# Patient Record
Sex: Male | Born: 1960
Health system: Southern US, Community
[De-identification: ages and names within clinical notes are randomized; demographics above are authoritative.]

## PROBLEM LIST (undated history)

## (undated) DIAGNOSIS — R091 Pleurisy: Secondary | ICD-10-CM

## (undated) DIAGNOSIS — G44009 Cluster headache syndrome, unspecified, not intractable: Secondary | ICD-10-CM

## (undated) DIAGNOSIS — I10 Essential (primary) hypertension: Secondary | ICD-10-CM

## (undated) DIAGNOSIS — D75A Glucose-6-phosphate dehydrogenase (G6PD) deficiency without anemia: Secondary | ICD-10-CM

## (undated) HISTORY — DX: Glucose-6-phosphate dehydrogenase (G6PD) deficiency without anemia: D75.A

## (undated) HISTORY — DX: Cluster headache syndrome, unspecified, not intractable: G44.009

## (undated) HISTORY — DX: Essential (primary) hypertension: I10

## (undated) HISTORY — PX: EXTENSOR TENDON OF FOREARM / WRIST REPAIR: SHX1547

## (undated) HISTORY — DX: Pleurisy: R09.1

---

## 1995-04-17 HISTORY — PX: MENISCUS REPAIR: SHX5179

## 1998-04-16 HISTORY — PX: MENISCUS REPAIR: SHX5179

## 2002-04-29 ENCOUNTER — Encounter: Payer: Self-pay | Admitting: Internal Medicine

## 2005-01-31 ENCOUNTER — Ambulatory Visit: Payer: Self-pay | Admitting: Internal Medicine

## 2006-07-23 ENCOUNTER — Emergency Department (HOSPITAL_COMMUNITY): Admission: EM | Admit: 2006-07-23 | Discharge: 2006-07-23 | Payer: Self-pay | Admitting: Family Medicine

## 2007-04-17 HISTORY — PX: ANTERIOR CRUCIATE LIGAMENT REPAIR: SHX115

## 2007-05-12 ENCOUNTER — Telehealth (INDEPENDENT_AMBULATORY_CARE_PROVIDER_SITE_OTHER): Payer: Self-pay | Admitting: *Deleted

## 2008-05-27 ENCOUNTER — Ambulatory Visit: Payer: Self-pay | Admitting: Internal Medicine

## 2008-05-27 DIAGNOSIS — G44009 Cluster headache syndrome, unspecified, not intractable: Secondary | ICD-10-CM | POA: Insufficient documentation

## 2008-05-27 DIAGNOSIS — F17200 Nicotine dependence, unspecified, uncomplicated: Secondary | ICD-10-CM | POA: Insufficient documentation

## 2008-05-27 DIAGNOSIS — G5600 Carpal tunnel syndrome, unspecified upper limb: Secondary | ICD-10-CM | POA: Insufficient documentation

## 2008-05-27 DIAGNOSIS — R002 Palpitations: Secondary | ICD-10-CM | POA: Insufficient documentation

## 2008-05-31 LAB — CONVERTED CEMR LAB
Alkaline Phosphatase: 63 units/L (ref 39–117)
BUN: 14 mg/dL (ref 6–23)
Bilirubin, Direct: 0.1 mg/dL (ref 0.0–0.3)
Chloride: 105 meq/L (ref 96–112)
Creatinine, Ser: 1.2 mg/dL (ref 0.4–1.5)
Eosinophils Absolute: 0.4 10*3/uL (ref 0.0–0.7)
Eosinophils Relative: 6.1 % — ABNORMAL HIGH (ref 0.0–5.0)
GFR calc Af Amer: 83 mL/min
Glucose, Bld: 89 mg/dL (ref 70–99)
MCV: 101.5 fL — ABNORMAL HIGH (ref 78.0–100.0)
Monocytes Relative: 8.7 % (ref 3.0–12.0)
Neutrophils Relative %: 40.9 % — ABNORMAL LOW (ref 43.0–77.0)
Platelets: 183 10*3/uL (ref 150–400)
Total Bilirubin: 0.7 mg/dL (ref 0.3–1.2)
Total Protein: 7.8 g/dL (ref 6.0–8.3)
WBC: 6.3 10*3/uL (ref 4.5–10.5)

## 2008-06-14 ENCOUNTER — Ambulatory Visit: Payer: Self-pay | Admitting: Internal Medicine

## 2008-06-14 DIAGNOSIS — R7401 Elevation of levels of liver transaminase levels: Secondary | ICD-10-CM | POA: Insufficient documentation

## 2008-06-14 DIAGNOSIS — R740 Nonspecific elevation of levels of transaminase and lactic acid dehydrogenase [LDH]: Secondary | ICD-10-CM

## 2008-06-14 DIAGNOSIS — R7402 Elevation of levels of lactic acid dehydrogenase (LDH): Secondary | ICD-10-CM | POA: Insufficient documentation

## 2008-06-15 LAB — CONVERTED CEMR LAB
ALT: 146 units/L — ABNORMAL HIGH (ref 0–53)
AST: 136 units/L — ABNORMAL HIGH (ref 0–37)
Alkaline Phosphatase: 67 units/L (ref 39–117)
Bilirubin, Direct: 0.1 mg/dL (ref 0.0–0.3)
Total Protein: 7.5 g/dL (ref 6.0–8.3)

## 2008-06-18 ENCOUNTER — Ambulatory Visit: Payer: Self-pay | Admitting: Internal Medicine

## 2008-06-18 DIAGNOSIS — B182 Chronic viral hepatitis C: Secondary | ICD-10-CM | POA: Insufficient documentation

## 2008-06-18 DIAGNOSIS — B1920 Unspecified viral hepatitis C without hepatic coma: Secondary | ICD-10-CM | POA: Insufficient documentation

## 2008-06-24 ENCOUNTER — Telehealth: Payer: Self-pay | Admitting: Family Medicine

## 2008-06-24 LAB — CONVERTED CEMR LAB

## 2008-10-05 ENCOUNTER — Encounter (INDEPENDENT_AMBULATORY_CARE_PROVIDER_SITE_OTHER): Payer: Self-pay | Admitting: Internal Medicine

## 2008-10-05 ENCOUNTER — Ambulatory Visit: Payer: Self-pay | Admitting: Family Medicine

## 2008-10-05 DIAGNOSIS — T676XXA Heat fatigue, transient, initial encounter: Secondary | ICD-10-CM | POA: Insufficient documentation

## 2008-10-06 ENCOUNTER — Encounter (INDEPENDENT_AMBULATORY_CARE_PROVIDER_SITE_OTHER): Payer: Self-pay | Admitting: Internal Medicine

## 2009-08-04 ENCOUNTER — Encounter: Payer: Self-pay | Admitting: Internal Medicine

## 2009-08-09 ENCOUNTER — Ambulatory Visit: Payer: Self-pay | Admitting: Internal Medicine

## 2010-05-16 NOTE — Assessment & Plan Note (Signed)
Summary: URI   Vital Signs:  Patient profile:   50 year old male Weight:      169 pounds BMI:     25.79 O2 Sat:      96 % on Room air Temp:     99.0 degrees F oral Pulse rate:   96 / minute Pulse rhythm:   regular Resp:     14 per minute BP sitting:   130 / 80  (left arm) Cuff size:   large  Vitals Entered By: Mervin Hack CMA Duncan Dull) (August 09, 2009 12:59 PM)  O2 Flow:  Room air CC: upper respitatory infection   History of Present Illness: Has chest infection started about 5 days ago but now feels bad Chest is sore Lots of cough--"non stop"  Has bilateral frontal pleuritic chest pain also in neck and back  ?low grade fever Tiring very easy--had to leave work  Slight mucus--yellowish some SOB--mostly DOE Hard to sleep due to cough  Constant rhinorrhea and congestion No ear pain SOme sore throat  using ibuprofen--helps some  Allergies: 1)  ! Sulfasalazine (Sulfasalazine)  Past History:  Past medical, surgical, family and social histories (including risk factors) reviewed for relevance to current acute and chronic problems.  Past Medical History: Reviewed history from 06/15/2008 and no changes required. Cluster headaches ?G6PD deficiency  Past Surgical History: Reviewed history from 05/27/2008 and no changes required.  ~1970 Left forearm repair 1997 Right knee meniscus repair [facility]) 2000 Left knee meniscus repair ([facility]) 2009 Right knee ACL/MCL removed ([facility])  Family History: Reviewed history from 05/27/2008 and no changes required. Dad died @64  CHF, DM Mom died of lung cancer @59  3 brothers--2 with colon polyps 2 sisters No HTN No prostate or colon cancer  Social History: Reviewed history from 05/27/2008 and no changes required. 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  NAD Head:  no sinus tenderness Ears:  R ear normal and L ear normal.   Nose:  mild congestion and inflammation Mouth:  no erythema and no exudates.   Neck:  supple, no masses, and no cervical lymphadenopathy.   Lungs:  normal respiratory effort, no intercostal retractions, no accessory muscle use, no dullness, and no crackles.  Mild exp prolongation and exp wheezes Additional Exam:  spirometry normal   Impression & Recommendations:  Problem # 1:  BRONCHITIS- ACUTE (ICD-466.0) Assessment New  has sig findings of wheezing though normal spirometry seems to have bronchial spasm  will treat with antibiotic antitussive and prednisone  Orders: Spirometry w/Graph (94010)  His updated medication list for this problem includes:    Amoxicillin-pot Clavulanate 875-125 Mg Tabs (Amoxicillin-pot clavulanate) .Marland Kitchen... 1 tab by mouth two times a day after eating for bronchitis  Problem # 2:  CIGARETTE SMOKER (ICD-305.1) Assessment: Comment Only  discussed cessation counselled on options  Orders: Tobacco use cessation intermediate 3-10 minutes (99406)  Problem # 3:  UNSPECIFIED VIRAL HEPATITIS C W/O HEPATIC COMA (ICD-070.70) Assessment: Comment Only hepatitis C chronic still not sure about getting biopsy or considering Rx will discuss at physical  Complete Medication List: 1)  Sumatriptan Succinate 6 Mg/0.55ml Soln (Sumatriptan succinate) .... Inject one at the onset of a cluster headache 2)  Naproxen Sodium 220 Mg Tabs (Naproxen sodium) .... One twice daily 3)  Ibuprofen 200 Mg Tabs (Ibuprofen) .... Otc as directed 4)  Amoxicillin-pot Clavulanate 875-125 Mg Tabs (Amoxicillin-pot clavulanate) .Marland Kitchen.. 1 tab by mouth two times a day after eating for bronchitis 5)  Prednisone 20 Mg Tabs (Prednisone) .... 2 tabs daily for wheezing 6)  Tramadol Hcl 50 Mg Tabs (Tramadol hcl) .Marland Kitchen.. 1 tab by mouth three times a day as needed for severe  cough  Patient Instructions: 1)  Please schedule a follow-up appointment in 4-6  months  for physical Prescriptions: TRAMADOL HCL 50 MG TABS (TRAMADOL HCL) 1 tab by mouth three times a day as needed for severe cough  #30 x 0   Entered and Authorized by:   Cindee Salt MD   Signed by:   Cindee Salt MD on 08/09/2009   Method used:   Electronically to        [facility]* (retail)       [address]       [place], [place]  [address]       Ph: [phone]       Fax: [phone]   RxID:   [id number] PREDNISONE 20 MG TABS (PREDNISONE) 2 tabs daily for wheezing  #10 x 0   Entered and Authorized by:   Cindee Salt MD   Signed by:   Cindee Salt MD on 08/09/2009   Method used:   Electronically to        [facility]* (retail)       [address]       [place], [place]  [address]       Ph: [phone]       Fax: [phone]   RxID:   [id number] AMOXICILLIN-POT CLAVULANATE 875-125 MG TABS (AMOXICILLIN-POT CLAVULANATE) 1 tab by mouth two times a day after eating for bronchitis  #20 x 0   Entered and Authorized by:   Cindee Salt MD   Signed by:   Cindee Salt MD on 08/09/2009   Method used:   Electronically to        [facility]* (retail)       [address]       [place], [place]  [address]       Ph: [phone]       Fax: [phone]   RxID:   [id number]   Current Allergies (reviewed today): ! SULFASALAZINE (SULFASALAZINE)

## 2010-06-28 ENCOUNTER — Inpatient Hospital Stay (INDEPENDENT_AMBULATORY_CARE_PROVIDER_SITE_OTHER)
Admission: RE | Admit: 2010-06-28 | Discharge: 2010-06-28 | Disposition: A | Discharge status: Home / Self Care | Payer: BC Managed Care – PPO | Source: Ambulatory Visit | Attending: Family Medicine | Admitting: Family Medicine

## 2010-06-28 DIAGNOSIS — J40 Bronchitis, not specified as acute or chronic: Secondary | ICD-10-CM

## 2010-06-28 DIAGNOSIS — J069 Acute upper respiratory infection, unspecified: Secondary | ICD-10-CM

## 2010-09-05 ENCOUNTER — Encounter: Payer: Self-pay | Admitting: Internal Medicine

## 2010-09-06 ENCOUNTER — Ambulatory Visit (INDEPENDENT_AMBULATORY_CARE_PROVIDER_SITE_OTHER): Payer: BC Managed Care – PPO | Admitting: Family Medicine

## 2010-09-06 ENCOUNTER — Encounter: Payer: Self-pay | Admitting: Family Medicine

## 2010-09-06 VITALS — BP 130/70 | HR 71 | Temp 98.2°F | Ht 70.0 in | Wt 165.4 lb

## 2010-09-06 DIAGNOSIS — M25519 Pain in unspecified shoulder: Secondary | ICD-10-CM

## 2010-09-06 DIAGNOSIS — M25511 Pain in right shoulder: Secondary | ICD-10-CM | POA: Insufficient documentation

## 2010-09-06 MED ORDER — HYDROCODONE-ACETAMINOPHEN 5-500 MG PO TABS: 1.0000 | ORAL_TABLET | Freq: Every evening | ORAL | Status: DC | PRN

## 2010-09-06 NOTE — Progress Notes (Signed)
Intermittent R shoulder pain.  Pain with ROM above the head.  Lateral shoulder pain.  No known injury, pop, snap.  Woke up with pain.  R handed.  Taking occ aleve or ibuprofen.   Meds, vitals, and allergies reviewed.   ROS: See HPI.  Otherwise, noncontributory.  nad ncat Neck with normal rom R shoulder with normal inspection, pain with ext and terminal phase of lateral abduction.  Hawking's negative but pain with ext rotation.  No pain on int rotation.  Pain with supraspinatus testing.  Distally NV intact. AC joint not ttp on testing.

## 2010-09-06 NOTE — Assessment & Plan Note (Signed)
Likely cuff pathology w/o complete tear.  D/w pt re: aleve with food, and handout given re: shoulder/scapular exercises.  If not improved, call back and we'll discuss options (ie, ortho referral, injection, formal PT).  He agrees, understands.

## 2010-09-06 NOTE — Patient Instructions (Signed)
Use the exercises as we discussed, take the vicodin at night for pain, and take 2 aleve twice a day with food.  Call me if not better.  Take care.

## 2010-12-27 ENCOUNTER — Ambulatory Visit (INDEPENDENT_AMBULATORY_CARE_PROVIDER_SITE_OTHER): Payer: BC Managed Care – PPO | Admitting: Family Medicine

## 2010-12-27 ENCOUNTER — Encounter: Payer: Self-pay | Admitting: Family Medicine

## 2010-12-27 VITALS — BP 130/88 | HR 73 | Temp 98.3°F | Ht 69.0 in | Wt 167.8 lb

## 2010-12-27 DIAGNOSIS — M25519 Pain in unspecified shoulder: Secondary | ICD-10-CM

## 2010-12-27 DIAGNOSIS — M2550 Pain in unspecified joint: Secondary | ICD-10-CM | POA: Insufficient documentation

## 2010-12-27 DIAGNOSIS — M25819 Other specified joint disorders, unspecified shoulder: Secondary | ICD-10-CM

## 2010-12-27 DIAGNOSIS — M7541 Impingement syndrome of right shoulder: Secondary | ICD-10-CM

## 2010-12-27 DIAGNOSIS — M25511 Pain in right shoulder: Secondary | ICD-10-CM

## 2010-12-27 MED ORDER — TRAMADOL HCL 50 MG PO TABS: 50.0000 mg | ORAL_TABLET | Freq: Four times a day (QID) | ORAL | Status: DC | PRN

## 2010-12-27 NOTE — Patient Instructions (Signed)
Recheck in 6 weeks. 

## 2010-12-27 NOTE — Progress Notes (Signed)
Subjective:    Patient ID: Roy Patel, male    DOB: March 31, 1961, 50 y.o.   MRN: [id number]  HPI  Roy Patel, a 50 y.o. male presents today in the office for the following:    Gentleman with a approximate 4-5 month history of right-sided shoulder pain, greater than his left shoulder. Pain with abduction. Pain with internal rotation. History is significant for an open comminuted fracture on the left forearm and resultant deficiency and motion in his left arm as a child.  Continues to have lateral pain, sometimes posterior pain,, but particularly pain with motion. No history of traumatic dislocation or subluxation. The patient is right-hand dominant. He does work at a [employer] course  He has been taking some Aleve.  Polyarthralgia. The patient complains of diffuse polyarthralgia ongoing for about 10 years. He complains of diffuse MCP and PIP as well as PIP pain. He also complains of pain in the true carpal joints. He's had extensive knee problems and 5 different surgeries. Right now he also complains of bilateral shoulder pain. He has no known history or diagnosis of rheumatological dysfunction. No known family history of rheumatological disease. He does complain of pain in the morning, as well as throughout the day. Does have some swelling and bogginess in his hands.  The PMH, PSH, Social History, Family History, Medications, and allergies have been reviewed in [facility], and have been updated if relevant.   Review of Systems REVIEW OF SYSTEMS  GEN: No fevers, chills. Nontoxic. Primarily MSK c/o today. MSK: Detailed in the HPI GI: tolerating PO intake without difficulty Neuro: No numbness, parasthesias, or tingling associated. Otherwise the pertinent positives of the ROS are noted above.      Objective:   Physical Exam   Physical Exam  Blood pressure 130/88, pulse 73, temperature 98.3 F (36.8 C), temperature source Oral, height 5\' 9"  (1.753 m), weight 167 lb 12.8 oz (76.114  kg), SpO2 98.00%.  GEN: Well-developed,well-nourished,in no acute distress; alert,appropriate and cooperative throughout examination HEENT: Normocephalic and atraumatic without obvious abnormalities. Ears, externally no deformities PULM: Breathing comfortably in no respiratory distress EXT: No clubbing, cyanosis, or edema PSYCH: Normally interactive. Cooperative during the interview. Pleasant. Friendly and conversant. Not anxious or depressed appearing. Normal, full affect.  Shoulder: R Inspection: No muscle wasting or winging Ecchymosis/edema: neg  AC joint, scapula, clavicle: NT Cervical spine: NT, full ROM Spurling's: neg Abduction: full, 5/5 - some pain with terminal motion Flexion: full, 5/5 IR, full, lift-off: 5/5 ER at neutral: full, 5/5 AC crossover: neg Neer: pos Hawkins: mild pos Drop Test: neg Empty Can: mild pos Supraspinatus insertion: nt Bicipital groove: NT Speed's: neg Yergason's: neg Crank neg obriens neg Sulcus sign: neg Scapular dyskinesis: none C5-T1 intact  Neuro: Sensation intact Grip 5/5 c      Assessment & Plan:   1. Shoulder pain, right  Ambulatory referral to Physical Therapy  2. Arthralgia  ANA, Cyclic citrul peptide antibody, IgG, Rheumatoid factor, High sensitivity CRP, Sedimentation rate  3. Impingement syndrome of right shoulder  Ambulatory referral to Physical Therapy    Shoulder anatomy was reviewed with the patient using and anatomical model.   Rotator cuff strengthening and scapular stabilization exercises were reviewed with the patient.  [facility] RTC and scapular stabilization program given to the patient.  Retraining shoulder mechanics and function was emphasized to the patient with rehab done at least 5-6 days a week.  The patient could benefit from formal PT to assist with scapular stabilization  and RTC strengthening.  SubAC Injection, R Verbal consent was obtained from the patient. Risks, benefits, and alternatives were  explained. Patient prepped with Betadine and Ethyl Chloride used for anesthesia. The subacromial space was injected using the posterior approach. The patient tolerated the procedure well and had decreased pain post injection. No complications. Injection: 9 cc of Lidocaine 1% and 1cc of depo-medrol 40 mg. Needle: 22 gauge  He certainly could have rheumatological disease, and we'll obtain basic laboratory panels.

## 2010-12-28 ENCOUNTER — Other Ambulatory Visit: Payer: BC Managed Care – PPO

## 2010-12-28 LAB — HIGH SENSITIVITY CRP: CRP, High Sensitivity: 0.68 mg/L (ref 0.000–5.000)

## 2010-12-29 LAB — CYCLIC CITRUL PEPTIDE ANTIBODY, IGG: Cyclic Citrullin Peptide Ab: <2 U/mL (ref 0.0–5.0)

## 2010-12-29 LAB — RHEUMATOID FACTOR: Rhuematoid fact SerPl-aCnc: 22 IU/mL — ABNORMAL HIGH (ref <=14)

## 2010-12-29 LAB — ANA: Anti Nuclear Antibody(ANA): NEGATIVE

## 2011-01-10 ENCOUNTER — Ambulatory Visit: Payer: BC Managed Care – PPO | Admitting: Physical Therapy

## 2011-01-10 ENCOUNTER — Ambulatory Visit: Payer: BC Managed Care – PPO | Attending: Family Medicine | Admitting: Physical Therapy

## 2011-05-16 ENCOUNTER — Ambulatory Visit (INDEPENDENT_AMBULATORY_CARE_PROVIDER_SITE_OTHER)
Admission: RE | Admit: 2011-05-16 | Discharge: 2011-05-16 | Disposition: A | Discharge status: Home / Self Care | Payer: BC Managed Care – PPO | Source: Ambulatory Visit | Attending: Family Medicine | Admitting: Family Medicine

## 2011-05-16 ENCOUNTER — Encounter: Payer: Self-pay | Admitting: Family Medicine

## 2011-05-16 ENCOUNTER — Ambulatory Visit (INDEPENDENT_AMBULATORY_CARE_PROVIDER_SITE_OTHER): Payer: BC Managed Care – PPO | Admitting: Family Medicine

## 2011-05-16 VITALS — BP 116/66 | HR 86 | Temp 98.3°F | Ht 69.0 in | Wt 169.0 lb

## 2011-05-16 DIAGNOSIS — J111 Influenza due to unidentified influenza virus with other respiratory manifestations: Secondary | ICD-10-CM

## 2011-05-16 DIAGNOSIS — F17200 Nicotine dependence, unspecified, uncomplicated: Secondary | ICD-10-CM

## 2011-05-16 DIAGNOSIS — J101 Influenza due to other identified influenza virus with other respiratory manifestations: Secondary | ICD-10-CM

## 2011-05-16 MED ORDER — GUAIFENESIN-CODEINE 100-10 MG/5ML PO SYRP: 5.0000 mL | ORAL_SOLUTION | Freq: Four times a day (QID) | ORAL | Status: AC | PRN

## 2011-05-16 NOTE — Assessment & Plan Note (Signed)
Disc in detail risks of smoking and possible outcomes including copd, vascular/ heart disease, cancer , respiratory and sinus infections  Pt voices understanding  

## 2011-05-16 NOTE — Patient Instructions (Signed)
I think you have the flu - but you are out of the time window for tamiflu  Will do chest xray on the way out -- just because of history of  Pneumonia  Please think about quitting smoking  Robitussin with codiene is for cough - hope that will help you sleep and help suppress cough  Avoid tylenol due to liver issues  When well you need flu shot and pneumovax Schedule follow up with your PCP when feeling better to discuss other symptoms

## 2011-05-16 NOTE — Assessment & Plan Note (Signed)
With cough and intermittent fever  Disc symptomatic care - see instructions on AVS  Given px robitussin with codiene cxr today in light of frequent pneumonia and smoking

## 2011-05-16 NOTE — Progress Notes (Signed)
Subjective:    Patient ID: Roy Patel, male    DOB: 1960/06/13, 51 y.o.   MRN: [id number]  HPI Thinks he may have the flu  Is on day 3 of fever - as high as 103 Started with mild st on fri-- then Sunday really started feeling bad  Feels worse as the day goes on   Is a smoker  Has had pneumonia in the past - 3-4 times No pneumovax and no flu shot   Is coughing -- is dry- and makes him hurt all over  Exhausted  Body aches from head to toe  No appetite Not sleeping well   No n/v/d   St today- not too bad - comes and goes  Nose feels dried out and blowing it -- some sinus pressure Some yellow discharge   Lab Results  Component Value Date   ALT 146* 06/14/2008   AST 136* 06/14/2008   ALKPHOS 67 06/14/2008   BILITOT 0.8 06/14/2008   knows he is not supposed to take tylenol- but has been taking a cold medicine with it anyway Overdue for f/u with pcp about several issues incl liver fxn  Patient Active Problem List  Diagnoses  . UNSPECIFIED VIRAL HEPATITIS C W/O HEPATIC COMA  . CIGARETTE SMOKER  . CLUSTER HEADACHE  . CARPAL TUNNEL SYNDROME  . PALPITATIONS  . NONSPEC ELEVATION OF LEVELS OF TRANSAMINASE/LDH  . HEAT FATIGUE, TRANSIENT  . Shoulder pain, right  . Arthralgia  . Influenza A   Past Medical History  Diagnosis Date  . Cluster headache   . G6PD deficiency     ?   Past Surgical History  Procedure Date  . Extensor tendon of forearm / wrist repair ~1970    Left  . Meniscus repair 1997    [place], Right knee  . Meniscus repair 2000    [facility], Left knee  . Anterior cruciate ligament repair 2009    MCL removed ([facility])   History  Substance Use Topics  . Smoking status: Current Everyday Smoker  . Smokeless tobacco: Not on file  . Alcohol Use: Yes   Family History  Problem Relation Age of Onset  . Cancer Mother     Lung  . Diabetes Father   . Heart disease Father     CHF  . Colon polyps Brother   . Hypertension Neg Hx   . Colon polyps Brother     Allergies  Allergen Reactions  . Sulfasalazine     REACTION: jaundice   Current Outpatient Prescriptions on File Prior to Visit  Medication Sig Dispense Refill  . naproxen sodium (ANAPROX) 220 MG tablet Take 220 mg by mouth 2 (two) times daily with a meal.        . HYDROcodone-acetaminophen (VICODIN) 5-500 MG per tablet Take 1 tablet by mouth at bedtime as needed for pain.  20 tablet  0  . SUMAtriptan (IMITREX) 6 MG/0.5ML SOLN Inject 6 mg into the skin. At the onset of a cluster headache.       . traMADol (ULTRAM) 50 MG tablet Take 1 tablet (50 mg total) by mouth every 6 (six) hours as needed for pain.  40 tablet  3       Review of Systems Review of Systems  Constitutional: Negative for  unexpected weight change. pos for fever/malaise/ fatigue ENT pos for nasal cong and st and neg for severe sinus pain  Eyes: Negative for pain and visual disturbance.  Respiratory: Negative for wheeze or sob  Cardiovascular: Negative for cp or palpitations    Gastrointestinal: Negative for nausea, diarrhea and constipation. neg for abd pain or jaundice  Genitourinary: Negative for urgency and frequency.  Skin: Negative for pallor or rash   Neurological: Negative for weakness, light-headedness, numbness and headaches.  Hematological: Negative for adenopathy. Does not bruise/bleed easily.  Psychiatric/Behavioral: Negative for dysphoric mood. The patient is not nervous/anxious.          Objective:   Physical Exam  Constitutional: He appears well-developed and well-nourished. No distress.  HENT:  Head: Normocephalic and atraumatic.  Right Ear: External ear normal.  Left Ear: External ear normal.  Mouth/Throat: No oropharyngeal exudate.       Nares are injected and congested  No sinus tenderness  throat- post nasal drainage clear    Eyes: Conjunctivae and EOM are normal. Pupils are equal, round, and reactive to light. Right eye exhibits no discharge. Left eye exhibits no discharge. No  scleral icterus.  Neck: Normal range of motion. Neck supple. No JVD present. No thyromegaly present.  Cardiovascular: Normal rate, regular rhythm and normal heart sounds.   Pulmonary/Chest: Effort normal and breath sounds normal. No respiratory distress. He has no wheezes. He has no rales. He exhibits no tenderness.       Diffusely distant bs  Harsh bs throughout   Abdominal: Soft. Bowel sounds are normal.  Lymphadenopathy:    He has no cervical adenopathy.  Neurological: He is alert.  Skin: Skin is warm and dry. No rash noted. No pallor.  Psychiatric: He has a normal mood and affect.          Assessment & Plan:

## 2011-05-18 ENCOUNTER — Telehealth: Payer: Self-pay | Admitting: Internal Medicine

## 2011-05-18 NOTE — Telephone Encounter (Signed)
I did already comment on it -should be in Rena's box ... Clear cxr/ re assuring -- so let me know if not improving clinically within the next week

## 2011-05-21 ENCOUNTER — Telehealth: Payer: Self-pay | Admitting: Internal Medicine

## 2011-05-21 NOTE — Telephone Encounter (Signed)
Triage Record Num: [id number] Operator: Tarri Glenn Patient Name: Roy Patel Call Date & Time: 05/19/2011 9:02:22AM Patient Phone: [phone] PCP: Tillman Abide Patient Gender: Male PCP Fax : [phone] Patient DOB: 08/10/1960 Practice Name[facility] Reason for Call: Caller: Jolene/Spouse; PCP: Tillman Abide I.; CB#: [phone]; Call regarding Fever x6 days; Wife/Jolene is calling about fever. Onset 05/13/11. Patient was seen in office 05/16/11, diagnosed with flu, not tested, and possible pneumonia. Patient continues with fever and sore throat. Temp. 102.0 (O) @ 0200. Unable to recheck temperature at this time as patient is drinking fluids. All emergent s/s r/o with exception to any temperatured elevation in an individual with a weakened immune system or a frail elderly person per Flu-Like Symptoms protocol. Advised wife to take patient to ED/UC, will go to [facility]. Protocol(s) Used: Flu-Like Symptoms Recommended Outcome per Protocol: Call Provider within 4 Hours Reason for Outcome: Any temperature elevation in an individual with a weakened immune system OR a frail elderly person Care Advice: Most adults need to drink 6-10 eight-ounce glasses (1.2-2.0 liters) of fluids per day unless previously told to limit fluid intake for other medical reasons. Limit fluids that contain caffeine, sugar or alcohol. Urine will be a very light yellow color when you drink enough fluids. ~ 05/19/2011 9:13:26AM Page 1 of 1 [id number]

## 2011-05-21 NOTE — Telephone Encounter (Signed)
Patient notified as instructed by telephone. Pt said he feels much better today. If symptoms worsen pt will call back.

## 2011-05-21 NOTE — Telephone Encounter (Signed)
Please check on him 

## 2011-05-23 NOTE — Telephone Encounter (Signed)
Called left message for patient to return my call.

## 2011-05-25 NOTE — Telephone Encounter (Signed)
Patient still not return my call, will wait for pt to return call,

## 2011-06-06 ENCOUNTER — Ambulatory Visit (INDEPENDENT_AMBULATORY_CARE_PROVIDER_SITE_OTHER): Payer: BC Managed Care – PPO | Admitting: Internal Medicine

## 2011-06-06 ENCOUNTER — Encounter: Payer: Self-pay | Admitting: Internal Medicine

## 2011-06-06 VITALS — BP 118/70 | HR 84 | Temp 98.1°F | Ht 69.0 in | Wt 165.0 lb

## 2011-06-06 DIAGNOSIS — L723 Sebaceous cyst: Secondary | ICD-10-CM

## 2011-06-06 MED ORDER — SUMATRIPTAN SUCCINATE 6 MG/0.5ML ~~LOC~~ SOLN: 6.0000 mg | Freq: Once | SUBCUTANEOUS | Status: DC

## 2011-06-06 NOTE — Progress Notes (Signed)
  Subjective:    Patient ID: Roy Patel, male    DOB: 1960/07/22, 51 y.o.   MRN: [id number]  HPI Has some bumps on his neck Thought it was related to cluster headaches---gets knots there when the headaches  Notes different lumps for about 1 weeks 2 now--- 1 on either side Right one seems to be getting larger, left one may be larger Not red, warm or tender  Having trouble with skin Itchy Rash on back finally clearing some Tried neosporin body wash and sarna lotion Generally a winter time phenomenon Mostly a problem at night---has tried benedryl and zyrtec Gas heat  Current Outpatient Prescriptions on File Prior to Visit  Medication Sig Dispense Refill  . ibuprofen (ADVIL,MOTRIN) 200 MG tablet Take 800 mg by mouth every 6 (six) hours as needed.      . naproxen sodium (ANAPROX) 220 MG tablet Take 220 mg by mouth 2 (two) times daily with a meal.        . SUMAtriptan (IMITREX) 6 MG/0.5ML SOLN Inject 6 mg into the skin. At the onset of a cluster headache.         Allergies  Allergen Reactions  . Sulfasalazine     REACTION: jaundice    Past Medical History  Diagnosis Date  . Cluster headache   . G6PD deficiency     ?    Past Surgical History  Procedure Date  . Extensor tendon of forearm / wrist repair ~1970    Left  . Meniscus repair 1997    [place], Right knee  . Meniscus repair 2000    [facility], Left knee  . Anterior cruciate ligament repair 2009    MCL removed ([facility])    Family History  Problem Relation Age of Onset  . Cancer Mother     Lung  . Diabetes Father   . Heart disease Father     CHF  . Colon polyps Brother   . Hypertension Neg Hx   . Colon polyps Brother     History   Social History  . Marital Status: Single    Spouse Name: N/A    Number of Children: 1  . Years of Education: N/A   Occupational History  . Golf Maintenance at [employer]    Social History Main Topics  . Smoking status: Current Everyday Smoker  . Smokeless tobacco:  Never Used  . Alcohol Use: Yes  . Drug Use: Not on file  . Sexually Active: Not on file   Other Topics Concern  . Not on file   Social History Narrative   Married:  2ndSon in [place] daughter here.   Review of Systems Flu symptoms lingered for about 2 weeks---fever for 7-8 days Appetite is okay again     Objective:   Physical Exam  Constitutional: He appears well-developed and well-nourished. No distress.  Neck: Normal range of motion. Neck supple.       Very small node in left posterior chain  No inflammation   Lymphadenopathy:    He has cervical adenopathy.  Skin: Skin is dry.       Small cyst on right posterior neck Dry skin without rash          Assessment & Plan:

## 2011-06-06 NOTE — Assessment & Plan Note (Signed)
Reassured No action needed Remove only if infected, etc  Discussed humidifier and moisturizers for his skin

## 2011-06-08 ENCOUNTER — Telehealth: Payer: Self-pay

## 2011-06-08 MED ORDER — HYDROXYZINE HCL 25 MG PO TABS: 25.0000 mg | ORAL_TABLET | Freq: Three times a day (TID) | ORAL | Status: DC | PRN

## 2011-06-08 NOTE — Telephone Encounter (Signed)
rx sent to pharmacy by e-script  

## 2011-06-08 NOTE — Telephone Encounter (Signed)
I forgot  Atarax (hydroxyzine) 25 mg tid prn for itching #90 x 1

## 2011-06-08 NOTE — Telephone Encounter (Signed)
Received fax from patients pharmacy.  Patient was seen on 06/06/11 and said he was supposed to have a prescription for Atarax sent to his pharmacy.  They have not received.

## 2011-07-18 ENCOUNTER — Encounter: Payer: Self-pay | Admitting: Family Medicine

## 2011-07-18 ENCOUNTER — Encounter: Payer: Self-pay | Admitting: *Deleted

## 2011-07-18 ENCOUNTER — Ambulatory Visit (INDEPENDENT_AMBULATORY_CARE_PROVIDER_SITE_OTHER): Payer: BC Managed Care – PPO | Admitting: Family Medicine

## 2011-07-18 VITALS — BP 120/72 | HR 94 | Temp 98.3°F | Ht 69.0 in | Wt 161.1 lb

## 2011-07-18 DIAGNOSIS — J157 Pneumonia due to Mycoplasma pneumoniae: Secondary | ICD-10-CM

## 2011-07-18 DIAGNOSIS — J9801 Acute bronchospasm: Secondary | ICD-10-CM

## 2011-07-18 DIAGNOSIS — J189 Pneumonia, unspecified organism: Secondary | ICD-10-CM

## 2011-07-18 MED ORDER — AZITHROMYCIN 250 MG PO TABS: ORAL_TABLET | ORAL | Status: AC

## 2011-07-18 MED ORDER — PREDNISONE 20 MG PO TABS: ORAL_TABLET | ORAL | Status: AC

## 2011-07-18 NOTE — Progress Notes (Signed)
  Patient Name: Roy Patel Date of Birth: Sep 02, 1960 Age: 51 y.o. Medical Record Number: [id number] Gender: male Date of Encounter: 07/18/2011  History of Present Illness:  Roy Patel is a 51 y.o. very pleasant male patient who presents with the following:  Has had a coldfor about 3-4 weeks, and had some uri sx with cough and congestion that has worsened and gotten into his chest. Has been in the bed since yesterday. Got done yesterday around 11. + Smoker Productive cough - feels much worse in his chest.  The patient got very short winded very easily yesterday with minimal exertion while at work.   Past Medical History, Surgical History, Social History, Family History, Problem List, Medications, and Allergies have been reviewed and updated if relevant.  Review of Systems: ROS: GEN: Acute illness details above GI: Tolerating PO intake GU: maintaining adequate hydration and urination Pulm: above Interactive and getting along well at home.  Otherwise, ROS is as per the HPI.   Physical Examination: Filed Vitals:   07/18/11 1510  BP: 120/72  Pulse: 94  Temp: 98.3 F (36.8 C)  TempSrc: Oral  Height: 5\' 9"  (1.753 m)  Weight: 161 lb 1.9 oz (73.084 kg)  SpO2: 98%    Body mass index is 23.79 kg/(m^2).   GEN: A and O x 3. WDWN. NAD.    ENT: Nose clear, ext NML.  No LAD.  No JVD.  TM's clear. Oropharynx clear.  PULM: Normal WOB, no distress. Diffuse scattered rhonchi. Intermittent wheezing, mildly  CV: RRR, no M/G/R, No rubs, No JVD.   EXT: warm and well-perfused, No c/c/e. PSYCH: Pleasant and conversant.   Assessment and Plan:  1. Walking pneumonia  azithromycin (ZITHROMAX Z-PAK) 250 MG tablet, predniSONE (DELTASONE) 20 MG tablet  2. Bronchospasm  azithromycin (ZITHROMAX Z-PAK) 250 MG tablet, predniSONE (DELTASONE) 20 MG tablet   Take the next 2 days off of work, treat with antibiotics and prednisone for wheezing and bronchospasm. H/o tob concern for potential  underlying copd  Orders Today: No orders of the defined types were placed in this encounter.    Medications Today: Meds ordered this encounter  Medications  . azithromycin (ZITHROMAX Z-PAK) 250 MG tablet    Sig: Take 2 tablets (500 mg) on  Day 1,  followed by 1 tablet (250 mg) once daily on Days 2 through 5.    Dispense:  6 each    Refill:  0  . predniSONE (DELTASONE) 20 MG tablet    Sig: 2 tabs po x 5 days, then 1 tab po x 3 days    Dispense:  21 tablet    Refill:  0

## 2011-09-28 ENCOUNTER — Encounter: Payer: BC Managed Care – PPO | Admitting: Internal Medicine

## 2011-10-22 ENCOUNTER — Other Ambulatory Visit: Payer: Self-pay | Admitting: *Deleted

## 2011-10-22 NOTE — Telephone Encounter (Signed)
Okay #9 x 3

## 2011-10-22 NOTE — Telephone Encounter (Signed)
Note from pharmacy, pt is in a cluster migraine cycle and needs a refill, please advise

## 2011-10-23 ENCOUNTER — Telehealth: Payer: Self-pay | Admitting: *Deleted

## 2011-10-23 MED ORDER — SUMATRIPTAN SUCCINATE 6 MG/0.5ML ~~LOC~~ SOLN: 6.0000 mg | Freq: Once | SUBCUTANEOUS | Status: DC

## 2011-10-23 NOTE — Telephone Encounter (Signed)
Form faxed back.

## 2011-10-23 NOTE — Telephone Encounter (Signed)
Patient's wife, Roy Patel, called to request that the authorization for the patient's medications for cluster headaches be sent back today for refill.  He is having cluster headaches and only has enough meds for today and Rob and Marcheta Grammes said that he sent over refill request and she said that she had started process for authorization with [employer] and the Case ID is [id number].  Please review, sign and return today to expedite.  Patient's wife's number is [phone] and home number is [phone].  Please call to confirm and complete requests.

## 2011-10-23 NOTE — Telephone Encounter (Signed)
Prior auth form on your desk for SUMATRIPTAN SUCCINATE

## 2011-10-23 NOTE — Telephone Encounter (Signed)
Form done 

## 2011-10-23 NOTE — Telephone Encounter (Signed)
rx sent to pharmacy by e-script  

## 2011-10-24 NOTE — Telephone Encounter (Signed)
I filled in the last box but wasn't sure how many to request for a month---so I put down #20 He needs it daily when the cluster headaches hit

## 2011-10-24 NOTE — Telephone Encounter (Signed)
Form faxed back again

## 2011-10-24 NOTE — Telephone Encounter (Signed)
[employer] sent form back that some sections where not filled out, see form on your desk.

## 2014-04-28 ENCOUNTER — Ambulatory Visit (INDEPENDENT_AMBULATORY_CARE_PROVIDER_SITE_OTHER): Payer: BLUE CROSS/BLUE SHIELD | Admitting: Internal Medicine

## 2014-04-28 ENCOUNTER — Encounter: Payer: Self-pay | Admitting: Internal Medicine

## 2014-04-28 VITALS — BP 158/98 | HR 75 | Temp 98.4°F | Wt 167.0 lb

## 2014-04-28 DIAGNOSIS — I10 Essential (primary) hypertension: Secondary | ICD-10-CM

## 2014-04-28 DIAGNOSIS — R42 Dizziness and giddiness: Secondary | ICD-10-CM

## 2014-04-28 MED ORDER — HYDROCHLOROTHIAZIDE 25 MG PO TABS: 25.0000 mg | ORAL_TABLET | Freq: Every day | ORAL | Status: DC

## 2014-04-28 NOTE — Patient Instructions (Signed)

## 2014-04-28 NOTE — Progress Notes (Signed)
Pre visit review using our clinic review tool, if applicable. No additional management support is needed unless otherwise documented below in the visit note. 

## 2014-04-28 NOTE — Progress Notes (Signed)
Subjective:    Patient ID: Roy SierrasMark G Schuitema, male    DOB: 05/12/1960, 54 y.o.   MRN: [id number]  HPI  Pt presents to the clinic today with c/o dizziness. He reports this started 1 month ago. It is intermittent. He reports that the room is not spinning but her feels more of a sense of imbalanced. He gets dizzy with position changes. He has also has walked into a wall because of the dizziness. His blood pressure is elevated today at 154/102. He has never been told that he has had high blood pressure in the past.  Review of Systems  Past Medical History  Diagnosis Date  . Cluster headache   . G6PD deficiency     ?    Current Outpatient Prescriptions  Medication Sig Dispense Refill  . hydrOXYzine (ATARAX/VISTARIL) 25 MG tablet Take 1 tablet (25 mg total) by mouth 3 (three) times daily as needed. 90 tablet 1  . ibuprofen (ADVIL,MOTRIN) 200 MG tablet Take 800 mg by mouth every 6 (six) hours as needed.    . naproxen sodium (ANAPROX) 220 MG tablet Take 220 mg by mouth 2 (two) times daily with a meal.      . SUMAtriptan (IMITREX) 6 MG/0.5ML SOLN injection Inject 0.5 mLs (6 mg total) into the skin once. At the onset of a cluster headache. 4.5 mL 3   No current facility-administered medications for this visit.    Allergies  Allergen Reactions  . Sulfasalazine     REACTION: jaundice    Family History  Problem Relation Age of Onset  . Cancer Mother     Lung  . Diabetes Father   . Heart disease Father     CHF  . Colon polyps Brother   . Hypertension Neg Hx   . Colon polyps Brother     History   Social History  . Marital Status: Single    Spouse Name: N/A    Number of Children: 1  . Years of Education: N/A   Occupational History  . Golf Maintenance at [employer]    Social History Main Topics  . Smoking status: Current Every Day Smoker -- 1.00 packs/day    Types: Cigarettes  . Smokeless tobacco: Never Used  . Alcohol Use: 0.0 oz/week    0 Not specified per week   Comment: occasional  . Drug Use: Not on file  . Sexual Activity: Not on file   Other Topics Concern  . Not on file   Social History Narrative   Married:  2nd   Son in [place]   Step daughter here.     Constitutional: Denies fever, malaise, fatigue, headache or abrupt weight changes.  HEENT: Denies eye pain, eye redness, ear pain, ringing in the ears, wax buildup, runny nose, nasal congestion, bloody nose, or sore throat. Respiratory: Denies difficulty breathing, shortness of breath, cough or sputum production.   Cardiovascular: Denies chest pain, chest tightness, palpitations or swelling in the hands or feet.   Skin: Denies redness, rashes, lesions or ulcercations.  Neurological: dizziness. Denies difficulty with memory, difficulty with speech or problems with balance and coordination.   No other specific complaints in a complete review of systems (except as listed in HPI above).     Objective:   Physical Exam  BP 158/98 mmHg  Pulse 75  Temp(Src) 98.4 F (36.9 C) (Oral)  Wt 167 lb (75.751 kg)  SpO2 98% Wt Readings from Last 3 Encounters:  04/28/14 167 lb (75.751 kg)  07/18/11  161 lb 1.9 oz (73.084 kg)  06/06/11 165 lb (74.844 kg)    General: Appears his stated age, well developed, well nourished in NAD. Skin: Warm, dry and intact. No rashes, lesions or ulcerations noted. HEENT: Head: normal shape and size; Eyes: sclera white, no icterus, conjunctiva pink, PERRLA and EOMs intact; Ears: Tm's gray and intact, normal light reflex;  Cardiovascular: Normal rate and rhythm. S1,S2 noted.  No murmur, rubs or gallops noted. No JVD or BLE edema. No carotid bruits noted. Pulmonary/Chest: Normal effort and positive vesicular breath sounds. No respiratory distress. No wheezes, rales or ronchi noted.  Neurological: Alert and oriented.    BMET    Component Value Date/Time   NA 140 05/27/2008 1544   K 4.6 05/27/2008 1544   CL 105 05/27/2008 1544   CO2 30 05/27/2008 1544   GLUCOSE 89  05/27/2008 1544   BUN 14 05/27/2008 1544   CREATININE 1.2 05/27/2008 1544   CALCIUM 9.9 05/27/2008 1544   GFRNONAA 69 05/27/2008 1544   GFRAA 83 05/27/2008 1544    Lipid Panel  No results found for: CHOL, TRIG, HDL, CHOLHDL, VLDL, LDLCALC  CBC    Component Value Date/Time   WBC 6.3 05/27/2008 1544   RBC 4.03* 05/27/2008 1544   HGB 14.1 05/27/2008 1544   HCT 40.9 05/27/2008 1544   PLT 183 05/27/2008 1544   MCV 101.5* 05/27/2008 1544   MCHC 34.5 05/27/2008 1544   RDW 12.5 05/27/2008 1544   MONOABS 0.5 05/27/2008 1544   EOSABS 0.4 05/27/2008 1544   BASOSABS 0.1 05/27/2008 1544    Hgb A1C No results found for: HGBA1C       Assessment & Plan:   Dizziness:  Orthostatics normal ? If this is related to HBP Will treat with HCTZ 25 mg Stop smoking Will recheck BP and reassess dizziness in 3 weeks

## 2014-04-29 ENCOUNTER — Telehealth: Payer: Self-pay | Admitting: Internal Medicine

## 2014-04-29 NOTE — Telephone Encounter (Signed)
emmi emailed °

## 2014-04-30 ENCOUNTER — Telehealth: Payer: Self-pay | Admitting: Family Medicine

## 2014-04-30 ENCOUNTER — Telehealth: Payer: Self-pay

## 2014-04-30 NOTE — Telephone Encounter (Signed)
It is okay for him to be taking the HCTZ with a sulfa allergy. He needs to restart.

## 2014-04-30 NOTE — Telephone Encounter (Signed)
Roy Patel left v/m; pt seen 04/28/14 and was started on HCTZ and pts wife read info with med that HCTZ is contraindicated with pts with sulfa allergies; Roy Patel advised pt to stop taking HCTZ and request cb to advise what substitute med can be given. [facility]. Do not find DPR signed to release info to anyone.Please advise.

## 2014-05-03 NOTE — Telephone Encounter (Signed)
Please refer to msg below and advise as wife wants to know what to do--

## 2014-05-03 NOTE — Telephone Encounter (Signed)
There is a possible cross reactivity reaction, yes, and a possibility he could be allergic to HCTZ.  I think it is quite rare but if he is uncomfortable, you could try a different rx.

## 2014-05-03 NOTE — Telephone Encounter (Signed)
Can try chlorthalidone instead--- I don't think there is any sulfa crossreactivity to that If he wants to, start 25mg  daily  #30 x 3 Make sure he has follow up in 6-8 weeks

## 2014-05-03 NOTE — Telephone Encounter (Signed)
Patient's wife,Jolene, called to find out about the prescription.  Please call her back at [phone].

## 2014-05-03 NOTE — Telephone Encounter (Signed)
Which medication would you suggest if pt wants to try something different as i want to make sure i have answers if asked--please advise

## 2014-05-03 NOTE — Telephone Encounter (Signed)
Pt's wife called back and states the sulfasalazine allergy that was listed in chart was wrong--it was suppose to state sulfa Ax--this has been changed in chart. However she wants to know if response below would still appy--please advise

## 2014-05-04 MED ORDER — CHLORTHALIDONE 25 MG PO TABS: 25.0000 mg | ORAL_TABLET | Freq: Every day | ORAL | Status: DC

## 2014-05-04 NOTE — Addendum Note (Signed)
Addended by: Sueanne MargaritaSMITH, DESHANNON L on: 05/04/2014 03:33 PM   Modules accepted: Orders, Medications

## 2014-05-04 NOTE — Telephone Encounter (Addendum)
Jolene, pts wife left v/m requesting cb today [phone] or [phone] about contraindication of taking HCTZ. ? DPR signed.

## 2014-05-04 NOTE — Telephone Encounter (Signed)
Spoke with wife and advised results, pt did not have a reaction to HCTZ, his wife stated that in the leaflet it read that if he was allergic to Sulfa to stop taking the medication. rx sent to pharmacy by e-script F/u appt scheduled

## 2014-05-12 NOTE — Telephone Encounter (Signed)
Melanie, please check into this. Originally sent to you by Lyla Sonarrie on 1/18.  Thanks!

## 2014-05-19 ENCOUNTER — Ambulatory Visit: Payer: BLUE CROSS/BLUE SHIELD | Admitting: Internal Medicine

## 2014-05-27 ENCOUNTER — Ambulatory Visit (INDEPENDENT_AMBULATORY_CARE_PROVIDER_SITE_OTHER): Payer: BLUE CROSS/BLUE SHIELD | Admitting: Internal Medicine

## 2014-05-27 ENCOUNTER — Encounter: Payer: Self-pay | Admitting: Internal Medicine

## 2014-05-27 VITALS — BP 130/86 | HR 75 | Temp 98.1°F | Wt 165.8 lb

## 2014-05-27 DIAGNOSIS — I10 Essential (primary) hypertension: Secondary | ICD-10-CM

## 2014-05-27 DIAGNOSIS — Z23 Encounter for immunization: Secondary | ICD-10-CM

## 2014-05-27 MED ORDER — LOSARTAN POTASSIUM 50 MG PO TABS: 50.0000 mg | ORAL_TABLET | Freq: Every day | ORAL | Status: DC

## 2014-05-27 NOTE — Patient Instructions (Signed)
Please call 1-800 QUIT NOW for advice about quitting smoking. I recommend stopping cold [place],  using a nicotine patch daily for at least 2-3 months and use a nicotine lozenge if you get an urge to smoke (so DON"T!!).

## 2014-05-27 NOTE — Assessment & Plan Note (Signed)
BP Readings from Last 3 Encounters:  05/27/14 130/86  04/28/14 158/98  07/18/11 120/72   Dizziness is gone so probably was from the blood pressure Unable to tolerate the chlorthalidone Will change to losartan

## 2014-05-27 NOTE — Progress Notes (Signed)
Pre visit review using our clinic review tool, if applicable. No additional management support is needed unless otherwise documented below in the visit note. 

## 2014-05-27 NOTE — Progress Notes (Signed)
Subjective:    Patient ID: Roy SierrasMark G Hoeppner, male    DOB: 01/10/1961, 54 y.o.   MRN: [id number]  HPI Here for follow up of dizziness This does seem better  Feels a little lethargic Some headaches---different than his clusters Had drenching sweat one night Slight chest pain since last visit--just sitting around. Only lasted a few seconds  Current Outpatient Prescriptions on File Prior to Visit  Medication Sig Dispense Refill  . chlorthalidone (HYGROTON) 25 MG tablet Take 1 tablet (25 mg total) by mouth daily. 30 tablet 3  . hydrOXYzine (ATARAX/VISTARIL) 25 MG tablet Take 1 tablet (25 mg total) by mouth 3 (three) times daily as needed. 90 tablet 1  . ibuprofen (ADVIL,MOTRIN) 200 MG tablet Take 800 mg by mouth every 6 (six) hours as needed.    . naproxen sodium (ANAPROX) 220 MG tablet Take 220 mg by mouth 2 (two) times daily with a meal.      . SUMAtriptan (IMITREX) 6 MG/0.5ML SOLN injection Inject 0.5 mLs (6 mg total) into the skin once. At the onset of a cluster headache. 4.5 mL 3   No current facility-administered medications on file prior to visit.    Allergies  Allergen Reactions  . Sulfa Antibiotics Other (See Comments)    Jaundice    Past Medical History  Diagnosis Date  . Cluster headache   . G6PD deficiency     ?    Past Surgical History  Procedure Laterality Date  . Extensor tendon of forearm / wrist repair  ~1970    Left  . Meniscus repair  1997    [place], Right knee  . Meniscus repair  2000    [place], Left knee  . Anterior cruciate ligament repair  2009    MCL removed ([facility])    Family History  Problem Relation Age of Onset  . Cancer Mother     Lung  . Diabetes Father   . Heart disease Father     CHF  . Colon polyps Brother   . Hypertension Neg Hx   . Colon polyps Brother     History   Social History  . Marital Status: Single    Spouse Name: N/A  . Number of Children: 1  . Years of Education: N/A   Occupational History  . Golf  Maintenance at [employer]    Social History Main Topics  . Smoking status: Current Every Day Smoker -- 1.00 packs/day    Types: Cigarettes  . Smokeless tobacco: Never Used  . Alcohol Use: 0.0 oz/week    0 Standard drinks or equivalent per week     Comment: occasional  . Drug Use: Not on file  . Sexual Activity: Not on file   Other Topics Concern  . Not on file   Social History Narrative   Married:  2nd   Son in [place]   Step daughter here.   Review of Systems Appetite is good Sleeping okay Recent eye check---BP noted to be high then Takes 800mg  ibuprofen many days    Objective:   Physical Exam  Constitutional: He appears well-developed and well-nourished. No distress.  Neck: Normal range of motion. Neck supple. No thyromegaly present.  Cardiovascular: Normal rate, regular rhythm and normal heart sounds.  Exam reveals no gallop.   No murmur heard. Pulmonary/Chest: Effort normal and breath sounds normal. No respiratory distress. He has no wheezes. He has no rales.  Lymphadenopathy:    He has no cervical adenopathy.  Psychiatric: He  has a normal mood and affect. His behavior is normal.          Assessment & Plan:

## 2014-05-28 ENCOUNTER — Telehealth: Payer: Self-pay | Admitting: Internal Medicine

## 2014-05-28 NOTE — Telephone Encounter (Signed)
emmi emailed °

## 2014-07-26 ENCOUNTER — Ambulatory Visit: Payer: BLUE CROSS/BLUE SHIELD | Admitting: Internal Medicine

## 2015-02-01 ENCOUNTER — Ambulatory Visit (INDEPENDENT_AMBULATORY_CARE_PROVIDER_SITE_OTHER): Payer: BLUE CROSS/BLUE SHIELD | Admitting: Family Medicine

## 2015-02-01 ENCOUNTER — Encounter: Payer: Self-pay | Admitting: Family Medicine

## 2015-02-01 VITALS — BP 138/72 | HR 88 | Temp 98.5°F | Ht 69.0 in | Wt 156.8 lb

## 2015-02-01 DIAGNOSIS — J209 Acute bronchitis, unspecified: Secondary | ICD-10-CM | POA: Insufficient documentation

## 2015-02-01 DIAGNOSIS — S161XXA Strain of muscle, fascia and tendon at neck level, initial encounter: Secondary | ICD-10-CM | POA: Diagnosis not present

## 2015-02-01 MED ORDER — AZITHROMYCIN 250 MG PO TABS: ORAL_TABLET | ORAL | Status: DC

## 2015-02-01 NOTE — Assessment & Plan Note (Signed)
No sign of cervical radiculopathy. Ost likely due to MSK strain versus OA in neck.  Treat with heat, massage, NSAIDs, gentle ROM exercises.

## 2015-02-01 NOTE — Assessment & Plan Note (Signed)
Treat with antibitoics given recurrent fever after 2 weeks and smoking history.

## 2015-02-01 NOTE — Progress Notes (Signed)
Pre visit review using our clinic review tool, if applicable. No additional management support is needed unless otherwise documented below in the visit note. 

## 2015-02-01 NOTE — Progress Notes (Signed)
Subjective:    Patient ID: Roy SierrasMark G Patel, male    DOB: 09/23/1960, 54 y.o.   MRN: [id number]  Cough This is a new problem. The current episode started 1 to 4 weeks ago ( 2 weeks). The problem has been waxing and waning. The cough is productive of purulent sputum and productive of sputum. Associated symptoms include a fever, myalgias, a sore throat, shortness of breath and wheezing. Pertinent negatives include no ear congestion, ear pain or nasal congestion. Associated symptoms comments: decreased energy.. Risk factors for lung disease include smoking/tobacco exposure. Treatments tried:  took 2 days of old antibiotics, not sure what it was felt better until 1 week later on call MD per wife insurance treated him for flu with tamiflu. Symptoms recurred. His past medical history is significant for bronchitis and pneumonia. There is no history of environmental allergies.  Fever  This is a new problem. The current episode started 1 to 4 weeks ago. The problem has been waxing and waning. The maximum temperature noted was 101 to 101.9 F. Associated symptoms include coughing, a sore throat and wheezing. Pertinent negatives include no ear pain.      Review of Systems  Constitutional: Positive for fever.  HENT: Positive for sore throat. Negative for ear pain.   Respiratory: Positive for cough, shortness of breath and wheezing.   Musculoskeletal: Positive for myalgias.  Allergic/Immunologic: Negative for environmental allergies.       Objective:   Physical Exam  Constitutional: Vital signs are normal. He appears well-developed and well-nourished.  Non-toxic appearance. He does not appear ill. No distress.  HENT:  Head: Normocephalic and atraumatic.  Right Ear: Hearing, tympanic membrane, external ear and ear canal normal. No tenderness. No foreign bodies. Tympanic membrane is not retracted and not bulging.  Left Ear: Hearing, tympanic membrane, external ear and ear canal normal. No tenderness. No  foreign bodies. Tympanic membrane is not retracted and not bulging.  Nose: Nose normal. No mucosal edema or rhinorrhea. Right sinus exhibits no maxillary sinus tenderness and no frontal sinus tenderness. Left sinus exhibits no maxillary sinus tenderness and no frontal sinus tenderness.  Mouth/Throat: Uvula is midline, oropharynx is clear and moist and mucous membranes are normal. Normal dentition. No dental caries. No oropharyngeal exudate or tonsillar abscesses.  Eyes: Conjunctivae, EOM and lids are normal. Pupils are equal, round, and reactive to light. Lids are everted and swept, no foreign bodies found.  Neck: Trachea normal, normal range of motion and phonation normal. Neck supple. Carotid bruit is not present. No thyroid mass and no thyromegaly present.  Cardiovascular: Normal rate, regular rhythm, S1 normal, S2 normal, normal heart sounds, intact distal pulses and normal pulses.  Exam reveals no gallop.   No murmur heard. Pulmonary/Chest: Effort normal and breath sounds normal. No respiratory distress. He has no wheezes. He has no rhonchi. He has no rales.  Abdominal: Soft. Normal appearance and bowel sounds are normal. There is no hepatosplenomegaly. There is no tenderness. There is no rebound, no guarding and no CVA tenderness. No hernia.  Musculoskeletal:       Cervical back: He exhibits decreased range of motion and tenderness. He exhibits no bony tenderness, no swelling, no edema and no deformity.  Neg spurling's  Neurological: He is alert. He has normal strength and normal reflexes. No sensory deficit. He exhibits normal muscle tone.  Skin: Skin is warm, dry and intact. No rash noted.  Psychiatric: He has a normal mood and affect. His speech is normal and  behavior is normal. Judgment normal.          Assessment & Plan:

## 2015-02-01 NOTE — Patient Instructions (Addendum)
Complete Z-pack.  Mucinex DM twice daily.  Start flonase  2 sprays per nostril daily for chronic congestion/allergies.  Rest, drink water.  Stop smoking.   Start gentle stretches of neck as reviewed. Statr ibuprofen 800 mg three times daily for pain and inflammation.  Heat and massage on neck . If not improving in 2 weeks, follow up with Dr. Alphonsus SiasLetvak.

## 2015-02-05 ENCOUNTER — Emergency Department (HOSPITAL_COMMUNITY): Payer: BLUE CROSS/BLUE SHIELD

## 2015-02-05 ENCOUNTER — Encounter (HOSPITAL_COMMUNITY): Payer: Self-pay | Admitting: Emergency Medicine

## 2015-02-05 ENCOUNTER — Inpatient Hospital Stay (HOSPITAL_COMMUNITY)
Admission: EM | Admit: 2015-02-05 | Discharge: 2015-02-08 | DRG: 194 | Disposition: A | Discharge status: Home / Self Care | Payer: BLUE CROSS/BLUE SHIELD | Attending: Internal Medicine | Admitting: Internal Medicine

## 2015-02-05 DIAGNOSIS — J189 Pneumonia, unspecified organism: Secondary | ICD-10-CM | POA: Diagnosis present

## 2015-02-05 DIAGNOSIS — D550 Anemia due to glucose-6-phosphate dehydrogenase [G6PD] deficiency: Secondary | ICD-10-CM | POA: Diagnosis not present

## 2015-02-05 DIAGNOSIS — I319 Disease of pericardium, unspecified: Secondary | ICD-10-CM | POA: Diagnosis present

## 2015-02-05 DIAGNOSIS — R079 Chest pain, unspecified: Secondary | ICD-10-CM | POA: Diagnosis not present

## 2015-02-05 DIAGNOSIS — Z8249 Family history of ischemic heart disease and other diseases of the circulatory system: Secondary | ICD-10-CM | POA: Diagnosis not present

## 2015-02-05 DIAGNOSIS — D75A Glucose-6-phosphate dehydrogenase (G6PD) deficiency without anemia: Secondary | ICD-10-CM | POA: Diagnosis present

## 2015-02-05 DIAGNOSIS — R109 Unspecified abdominal pain: Secondary | ICD-10-CM

## 2015-02-05 DIAGNOSIS — Z833 Family history of diabetes mellitus: Secondary | ICD-10-CM | POA: Diagnosis not present

## 2015-02-05 DIAGNOSIS — J209 Acute bronchitis, unspecified: Secondary | ICD-10-CM | POA: Diagnosis present

## 2015-02-05 DIAGNOSIS — Z809 Family history of malignant neoplasm, unspecified: Secondary | ICD-10-CM | POA: Diagnosis not present

## 2015-02-05 DIAGNOSIS — Z800 Family history of malignant neoplasm of digestive organs: Secondary | ICD-10-CM

## 2015-02-05 DIAGNOSIS — D649 Anemia, unspecified: Secondary | ICD-10-CM | POA: Diagnosis present

## 2015-02-05 DIAGNOSIS — I10 Essential (primary) hypertension: Secondary | ICD-10-CM | POA: Diagnosis present

## 2015-02-05 DIAGNOSIS — R091 Pleurisy: Secondary | ICD-10-CM | POA: Diagnosis present

## 2015-02-05 DIAGNOSIS — F17210 Nicotine dependence, cigarettes, uncomplicated: Secondary | ICD-10-CM | POA: Diagnosis present

## 2015-02-05 DIAGNOSIS — F17200 Nicotine dependence, unspecified, uncomplicated: Secondary | ICD-10-CM | POA: Diagnosis present

## 2015-02-05 DIAGNOSIS — R55 Syncope and collapse: Secondary | ICD-10-CM

## 2015-02-05 DIAGNOSIS — D72829 Elevated white blood cell count, unspecified: Secondary | ICD-10-CM | POA: Diagnosis not present

## 2015-02-05 DIAGNOSIS — E7401 von Gierke disease: Secondary | ICD-10-CM | POA: Diagnosis present

## 2015-02-05 LAB — URINALYSIS, ROUTINE W REFLEX MICROSCOPIC
Bilirubin Urine: NEGATIVE
GLUCOSE, UA: NEGATIVE mg/dL
Hgb urine dipstick: NEGATIVE
KETONES UR: NEGATIVE mg/dL
LEUKOCYTES UA: NEGATIVE
Nitrite: NEGATIVE
PROTEIN: NEGATIVE mg/dL
Specific Gravity, Urine: 1.003 — ABNORMAL LOW (ref 1.005–1.030)
Urobilinogen, UA: 0.2 mg/dL (ref 0.0–1.0)
pH: 5.5 (ref 5.0–8.0)

## 2015-02-05 LAB — HEPATIC FUNCTION PANEL
ALT: 54 U/L (ref 17–63)
AST: 57 U/L — ABNORMAL HIGH (ref 15–41)
Albumin: 4 g/dL (ref 3.5–5.0)
Alkaline Phosphatase: 93 U/L (ref 38–126)
BILIRUBIN TOTAL: 0.3 mg/dL (ref 0.3–1.2)
TOTAL PROTEIN: 8.2 g/dL — AB (ref 6.5–8.1)

## 2015-02-05 LAB — BASIC METABOLIC PANEL
Anion gap: 11 (ref 5–15)
BUN: 8 mg/dL (ref 6–20)
CALCIUM: 9.3 mg/dL (ref 8.9–10.3)
CO2: 23 mmol/L (ref 22–32)
CREATININE: 0.73 mg/dL (ref 0.61–1.24)
Chloride: 101 mmol/L (ref 101–111)
GFR calc non Af Amer: >60 mL/min (ref >60)
Glucose, Bld: 98 mg/dL (ref 65–99)
Potassium: 3.5 mmol/L (ref 3.5–5.1)
SODIUM: 135 mmol/L (ref 135–145)

## 2015-02-05 LAB — APTT: aPTT: 32 seconds (ref 24–37)

## 2015-02-05 LAB — CBC
HCT: 37.2 % — ABNORMAL LOW (ref 39.0–52.0)
Hemoglobin: 12.6 g/dL — ABNORMAL LOW (ref 13.0–17.0)
MCH: 32.5 pg (ref 26.0–34.0)
MCHC: 33.9 g/dL (ref 30.0–36.0)
MCV: 95.9 fL (ref 78.0–100.0)
PLATELETS: 277 10*3/uL (ref 150–400)
RBC: 3.88 MIL/uL — AB (ref 4.22–5.81)
RDW: 12.2 % (ref 11.5–15.5)
WBC: 12.6 10*3/uL — AB (ref 4.0–10.5)

## 2015-02-05 LAB — I-STAT TROPONIN, ED
TROPONIN I, POC: 0 ng/mL (ref 0.00–0.08)
Troponin i, poc: 0 ng/mL (ref 0.00–0.08)

## 2015-02-05 LAB — TYPE AND SCREEN
ABO/RH(D): A POS
ANTIBODY SCREEN: NEGATIVE

## 2015-02-05 LAB — STREP PNEUMONIAE URINARY ANTIGEN: Strep Pneumo Urinary Antigen: NEGATIVE

## 2015-02-05 LAB — PROTIME-INR
INR: 1.18 (ref 0.00–1.49)
Prothrombin Time: 15.2 seconds (ref 11.6–15.2)

## 2015-02-05 LAB — TROPONIN I: Troponin I: <0.03 ng/mL (ref <0.031)

## 2015-02-05 LAB — ABO/RH: ABO/RH(D): A POS

## 2015-02-05 LAB — HIV ANTIBODY (ROUTINE TESTING W REFLEX): HIV Screen 4th Generation wRfx: NONREACTIVE

## 2015-02-05 LAB — LIPASE, BLOOD: Lipase: 31 U/L (ref 11–51)

## 2015-02-05 LAB — MRSA PCR SCREENING: MRSA BY PCR: NEGATIVE

## 2015-02-05 MED ORDER — MAGNESIUM OXIDE 400 (241.3 MG) MG PO TABS
400.0000 mg | ORAL_TABLET | Freq: Two times a day (BID) | ORAL | Status: DC
  Administered 2015-02-05 – 2015-02-08 (×7): 400 mg via ORAL
  Filled 2015-02-05 (×7): qty 1

## 2015-02-05 MED ORDER — ASPIRIN 81 MG PO CHEW
324.0000 mg | CHEWABLE_TABLET | Freq: Once | ORAL | Status: AC
  Administered 2015-02-05: 324 mg via ORAL
  Filled 2015-02-05: qty 4

## 2015-02-05 MED ORDER — OXYCODONE HCL 5 MG PO TABS
5.0000 mg | ORAL_TABLET | ORAL | Status: DC | PRN
  Administered 2015-02-05 – 2015-02-08 (×9): 5 mg via ORAL
  Filled 2015-02-05 (×10): qty 1

## 2015-02-05 MED ORDER — SODIUM CHLORIDE 0.9 % IV BOLUS (SEPSIS)
2000.0000 mL | Freq: Once | INTRAVENOUS | Status: AC
Start: 2015-02-05 — End: 2015-02-05
  Administered 2015-02-05: 2000 mL via INTRAVENOUS

## 2015-02-05 MED ORDER — BENZONATATE 100 MG PO CAPS
200.0000 mg | ORAL_CAPSULE | Freq: Three times a day (TID) | ORAL | Status: DC
  Administered 2015-02-05 – 2015-02-08 (×10): 200 mg via ORAL
  Filled 2015-02-05 (×10): qty 2

## 2015-02-05 MED ORDER — PANTOPRAZOLE SODIUM 40 MG IV SOLR
40.0000 mg | INTRAVENOUS | Status: DC
  Administered 2015-02-05: 40 mg via INTRAVENOUS
  Filled 2015-02-05: qty 40

## 2015-02-05 MED ORDER — IOHEXOL 350 MG/ML SOLN
100.0000 mL | Freq: Once | INTRAVENOUS | Status: AC | PRN
  Administered 2015-02-05: 100 mL via INTRAVENOUS

## 2015-02-05 MED ORDER — SODIUM CHLORIDE 0.9 % IV SOLN
INTRAVENOUS | Status: AC
  Administered 2015-02-05: 05:00:00 via INTRAVENOUS

## 2015-02-05 MED ORDER — BOOST / RESOURCE BREEZE PO LIQD
1.0000 | Freq: Two times a day (BID) | ORAL | Status: DC
  Administered 2015-02-05 – 2015-02-08 (×6): 1 via ORAL

## 2015-02-05 MED ORDER — ALUM & MAG HYDROXIDE-SIMETH 200-200-20 MG/5ML PO SUSP
30.0000 mL | Freq: Four times a day (QID) | ORAL | Status: DC | PRN
Start: 2015-02-05 — End: 2015-02-08

## 2015-02-05 MED ORDER — ONDANSETRON HCL 4 MG PO TABS
4.0000 mg | ORAL_TABLET | Freq: Four times a day (QID) | ORAL | Status: DC | PRN
Start: 2015-02-05 — End: 2015-02-08

## 2015-02-05 MED ORDER — KETOROLAC TROMETHAMINE 30 MG/ML IJ SOLN
30.0000 mg | Freq: Once | INTRAMUSCULAR | Status: AC
  Administered 2015-02-05: 30 mg via INTRAVENOUS
  Filled 2015-02-05: qty 1

## 2015-02-05 MED ORDER — ENOXAPARIN SODIUM 40 MG/0.4ML ~~LOC~~ SOLN
40.0000 mg | SUBCUTANEOUS | Status: DC
  Administered 2015-02-05 – 2015-02-08 (×4): 40 mg via SUBCUTANEOUS
  Filled 2015-02-05 (×4): qty 0.4

## 2015-02-05 MED ORDER — HYDROMORPHONE HCL 1 MG/ML IJ SOLN
0.5000 mg | INTRAMUSCULAR | Status: DC | PRN
  Administered 2015-02-05 (×4): 1 mg via INTRAVENOUS
  Administered 2015-02-05: 0.5 mg via INTRAVENOUS
  Administered 2015-02-06 (×3): 1 mg via INTRAVENOUS
  Filled 2015-02-05 (×8): qty 1

## 2015-02-05 MED ORDER — NICOTINE 14 MG/24HR TD PT24
14.0000 mg | MEDICATED_PATCH | Freq: Every day | TRANSDERMAL | Status: DC
  Administered 2015-02-05 – 2015-02-08 (×5): 14 mg via TRANSDERMAL
  Filled 2015-02-05 (×5): qty 1

## 2015-02-05 MED ORDER — KETOROLAC TROMETHAMINE 30 MG/ML IJ SOLN
30.0000 mg | Freq: Four times a day (QID) | INTRAMUSCULAR | Status: DC | PRN
  Administered 2015-02-05 – 2015-02-06 (×2): 30 mg via INTRAVENOUS
  Filled 2015-02-05 (×2): qty 1

## 2015-02-05 MED ORDER — INFLUENZA VAC SPLIT QUAD 0.5 ML IM SUSY
0.5000 mL | PREFILLED_SYRINGE | INTRAMUSCULAR | Status: AC
  Administered 2015-02-07: 0.5 mL via INTRAMUSCULAR
  Filled 2015-02-05 (×2): qty 0.5

## 2015-02-05 MED ORDER — ASPIRIN EC 325 MG PO TBEC
325.0000 mg | DELAYED_RELEASE_TABLET | Freq: Every day | ORAL | Status: DC
  Administered 2015-02-05: 325 mg via ORAL
  Filled 2015-02-05: qty 1

## 2015-02-05 MED ORDER — FENTANYL CITRATE (PF) 100 MCG/2ML IJ SOLN
INTRAMUSCULAR | Status: AC
  Administered 2015-02-05: 50 ug
  Filled 2015-02-05: qty 2

## 2015-02-05 MED ORDER — MORPHINE SULFATE (PF) 4 MG/ML IV SOLN
4.0000 mg | Freq: Once | INTRAVENOUS | Status: AC
  Administered 2015-02-05: 4 mg via INTRAVENOUS
  Filled 2015-02-05: qty 1

## 2015-02-05 MED ORDER — CETYLPYRIDINIUM CHLORIDE 0.05 % MT LIQD
7.0000 mL | Freq: Two times a day (BID) | OROMUCOSAL | Status: DC
  Administered 2015-02-05 – 2015-02-08 (×6): 7 mL via OROMUCOSAL

## 2015-02-05 MED ORDER — CEFTRIAXONE SODIUM 1 G IJ SOLR
1.0000 g | INTRAMUSCULAR | Status: DC
  Administered 2015-02-06 – 2015-02-08 (×3): 1 g via INTRAVENOUS
  Filled 2015-02-05 (×5): qty 10

## 2015-02-05 MED ORDER — CEFTRIAXONE SODIUM 1 G IJ SOLR
1.0000 g | Freq: Once | INTRAMUSCULAR | Status: AC
  Administered 2015-02-05: 1 g via INTRAVENOUS
  Filled 2015-02-05: qty 10

## 2015-02-05 MED ORDER — COLCHICINE 0.6 MG PO TABS
0.6000 mg | ORAL_TABLET | Freq: Two times a day (BID) | ORAL | Status: DC
  Administered 2015-02-05: 0.6 mg via ORAL
  Filled 2015-02-05 (×2): qty 1

## 2015-02-05 MED ORDER — ADULT MULTIVITAMIN W/MINERALS CH
1.0000 | ORAL_TABLET | Freq: Every day | ORAL | Status: DC
Start: 2015-02-05 — End: 2015-02-08
  Administered 2015-02-05 – 2015-02-08 (×4): 1 via ORAL
  Filled 2015-02-05 (×4): qty 1

## 2015-02-05 MED ORDER — SODIUM CHLORIDE 0.9 % IV SOLN
INTRAVENOUS | Status: DC
  Administered 2015-02-05 – 2015-02-06 (×2): via INTRAVENOUS

## 2015-02-05 MED ORDER — DIPHENHYDRAMINE HCL 25 MG PO CAPS
50.0000 mg | ORAL_CAPSULE | Freq: Every day | ORAL | Status: DC
  Administered 2015-02-05 – 2015-02-07 (×3): 50 mg via ORAL
  Filled 2015-02-05 (×3): qty 2

## 2015-02-05 MED ORDER — ONDANSETRON HCL 4 MG/2ML IJ SOLN: 4.0000 mg | Freq: Four times a day (QID) | INTRAMUSCULAR | Status: DC | PRN

## 2015-02-05 MED ORDER — FENTANYL CITRATE (PF) 100 MCG/2ML IJ SOLN
50.0000 ug | Freq: Once | INTRAMUSCULAR | Status: AC
  Administered 2015-02-05: 50 ug via INTRAVENOUS
  Filled 2015-02-05: qty 2

## 2015-02-05 MED ORDER — LOSARTAN POTASSIUM 50 MG PO TABS
50.0000 mg | ORAL_TABLET | Freq: Every day | ORAL | Status: DC
  Administered 2015-02-05 – 2015-02-08 (×4): 50 mg via ORAL
  Filled 2015-02-05 (×4): qty 1

## 2015-02-05 MED ORDER — AZITHROMYCIN 500 MG IV SOLR
500.0000 mg | INTRAVENOUS | Status: DC
  Administered 2015-02-06: 500 mg via INTRAVENOUS
  Filled 2015-02-05 (×2): qty 500

## 2015-02-05 MED ORDER — MAGNESIUM OXIDE 400 MG PO TABS: 400.0000 mg | ORAL_TABLET | Freq: Two times a day (BID) | ORAL | Status: DC

## 2015-02-05 MED ORDER — ACETAMINOPHEN 650 MG RE SUPP: 650.0000 mg | Freq: Four times a day (QID) | RECTAL | Status: DC | PRN

## 2015-02-05 MED ORDER — DEXTROSE 5 % IV SOLN
500.0000 mg | Freq: Once | INTRAVENOUS | Status: AC
  Administered 2015-02-05: 500 mg via INTRAVENOUS
  Filled 2015-02-05: qty 500

## 2015-02-05 MED ORDER — ENSURE ENLIVE PO LIQD: 237.0000 mL | ORAL | Status: DC

## 2015-02-05 MED ORDER — ENSURE ENLIVE PO LIQD
237.0000 mL | Freq: Two times a day (BID) | ORAL | Status: DC
  Administered 2015-02-05: 237 mL via ORAL

## 2015-02-05 MED ORDER — ACETAMINOPHEN 325 MG PO TABS
650.0000 mg | ORAL_TABLET | Freq: Four times a day (QID) | ORAL | Status: DC | PRN
  Administered 2015-02-05 – 2015-02-06 (×2): 650 mg via ORAL
  Filled 2015-02-05 (×3): qty 2

## 2015-02-05 NOTE — H&P (Addendum)
[name] Admission History and Physical       [name]:[id number] DOB: 1960-10-11 DOA: 02/05/2015  Referring physician: EDP PCP: Tillman Abide, MD  Specialists:   Chief Complaint: Chest Pain  HPI: Roy Patel is a 54 y.o. male with a history of HTN, and G6PD Deficiency who presents to the ED with complaints of Left sided Chest pain and Left Shoulder and Upper ABD Pain since 9 PM .  He reports having 10/10 Pleuritic Chest pain, which is worse with laying supine.   He has SOB.  He had been treated for the Flu and Acute Bronchitis over the past 2 weeks and has had Tamiflu followed by Azithromycin prescribed by his PCP.   He was found in the ED to have a negative initial Troponin, and a Leukocytosis to 12.6, with Bibasilar opacities on Chest X-ray and a CTA of the Chest was performed which revealed Pneumonia  Versus atypical pulmonary edema versus pulmonary infarction subsequent to a resolved small pulmonary embolus.   He was placed on IV Antibiotic coverage for CAP.   He also was found to have Diffuse ST elevations on EKG, and Cardiolgy consultation was requested and Dr. Jacinto Halim is to see the patient this AM.   The differential also includes Pericarditis.        Review of Systems:  Constitutional: No Weight Loss, No Weight Gain, Night Sweats, Fevers, Chills, Dizziness, Light Headedness, Fatigue, or Generalized Weakness HEENT: No Headaches, Difficulty Swallowing,Tooth/Dental Problems,Sore Throat,  No Sneezing, Rhinitis, Ear Ache, Nasal Congestion, or Post Nasal Drip,  Cardio-vascular:  +Chest pain, Orthopnea, PND, Edema in Lower Extremities, Anasarca, Dizziness, Palpitations  Resp: +Dyspnea, No DOE, No Productive Cough, No Non-Productive Cough, No Hemoptysis, No Wheezing.    GI: No Heartburn, Indigestion, Abdominal Pain, Nausea, Vomiting, Diarrhea, Constipation, Hematemesis, Hematochezia, Melena, Change in Bowel Habits,  Loss of Appetite  GU: No Dysuria, No Change in Color  of Urine, No Urgency or Urinary Frequency, No Flank pain.  Musculoskeletal: No Joint Pain or Swelling, No Decreased Range of Motion, No Back Pain.  Neurologic: No Syncope, No Seizures, Muscle Weakness, Paresthesia, Vision Disturbance or Loss, No Diplopia, No Vertigo, No Difficulty Walking,  Skin: No Rash or Lesions. Psych: No Change in Mood or Affect, No Depression or Anxiety, No Memory loss, No Confusion, or Hallucinations   Past Medical History  Diagnosis Date  . Cluster headache   . G6PD deficiency (HCC)     ?  Marland Kitchen Hypertension      Past Surgical History  Procedure Laterality Date  . Extensor tendon of forearm / wrist repair  ~1970    Left  . Meniscus repair  1997    [place], Right knee  . Meniscus repair  2000    [place], Left knee  . Anterior cruciate ligament repair  2009    MCL removed ([facility])      Prior to Admission medications   Medication Sig Start Date End Date Taking? Authorizing Provider  azithromycin (ZITHROMAX) 250 MG tablet Take 2 tabs PO x 1 dose, then 1 tab PO QD x 4 days 02/01/15  Yes Amy E Bedsole, MD  benzonatate (TESSALON) 200 MG capsule Take 200 mg by mouth 3 (three) times daily.  01/26/15  Yes Historical Provider, MD  dextromethorphan-guaiFENesin (TUSSIN DM) 10-100 MG/5ML liquid Take 10 mLs by mouth every 4 (four) hours as needed for cough.   Yes Historical Provider, MD  ibuprofen (ADVIL,MOTRIN) 200 MG tablet Take 800 mg by mouth every 6 (  six) hours as needed for fever or mild pain.    Yes Historical Provider, MD  losartan (COZAAR) 50 MG tablet Take 1 tablet (50 mg total) by mouth daily. 05/27/14  Yes [name], MD  magnesium oxide (MAG-OX) 400 MG tablet Take 400 mg by mouth 2 (two) times daily.   Yes Historical Provider, MD  SUMAtriptan (IMITREX) 6 MG/0.5ML SOLN injection Inject 0.5 mLs (6 mg total) into the skin once. At the onset of a cluster headache. Patient not taking: Reported on 02/05/2015 10/22/11   [name], MD     Allergies    Allergen Reactions  . Chlorthalidone Other (See Comments)    Felt bad/lethargic  . Sulfa Antibiotics Other (See Comments)    Jaundice    Social History:  reports that he has been smoking Cigarettes.  He has been smoking about 1.00 pack per day. He has never used smokeless tobacco. He reports that he drinks alcohol. His drug history is not on file.    Family History  Problem Relation Age of Onset  . Cancer Mother     Lung  . Diabetes Father   . Heart disease Father     CHF  . Colon polyps Brother   . Hypertension Neg Hx   . Colon polyps Brother        Physical Exam:  GEN:  Pleasant Well Nourished and Well Developed 54 y.o. Caucasian male examined and in Discomfort but no acute distress; cooperative with exam Filed Vitals:   02/05/15 0300 02/05/15 0315 02/05/15 0330 02/05/15 0345  BP: 134/67 113/58 124/71 122/53  Pulse: 86 79 89 80  Temp:      TempSrc:      Resp: 19 17 27 21   SpO2: 97% 97% 96% 93%   Blood pressure 122/53, pulse 80, temperature 98.1 F (36.7 C), temperature source Oral, resp. rate 21, SpO2 93 %. PSYCH: He is alert and oriented x4; does not appear anxious does not appear depressed; affect is normal HEENT: Normocephalic and Atraumatic, Mucous membranes pink; PERRLA; EOM intact; Fundi:  Benign;  No scleral icterus, Nares: Patent, Oropharynx: Clear, Fair Dentition,    Neck:  FROM, No Cervical Lymphadenopathy nor Thyromegaly or Carotid Bruit; No JVD; Breasts:: Not examined CHEST WALL: No tenderness CHEST: Normal respiration, clear to auscultation bilaterally HEART: Regular rate and rhythm; no murmurs rubs or gallops BACK: No kyphosis or scoliosis; No CVA tenderness ABDOMEN: Positive Bowel Sounds, Soft Non-Tender, No Rebound or Guarding; No Masses, No Organomegaly. Rectal Exam: Not done EXTREMITIES: No Cyanosis, Clubbing, or Edema; No Ulcerations. Genitalia: not examined PULSES: 2+ and symmetric SKIN: Normal hydration no rash or ulceration CNS:  Alert and  Oriented x 4, No Focal Deficits Vascular: pulses palpable throughout    Labs on Admission:  Basic Metabolic Panel:  Recent Labs Lab 02/05/15 0037  NA 135  K 3.5  CL 101  CO2 23  GLUCOSE 98  BUN 8  CREATININE 0.73  CALCIUM 9.3   Liver Function Tests:  Recent Labs Lab 02/05/15 0109  AST 57*  ALT 54  ALKPHOS 93  BILITOT 0.3  PROT 8.2*  ALBUMIN 4.0    Recent Labs Lab 02/05/15 0109  LIPASE 31   No results for input(s): AMMONIA in the last 168 hours. CBC:  Recent Labs Lab 02/05/15 0037  WBC 12.6*  HGB 12.6*  HCT 37.2*  MCV 95.9  PLT 277   Cardiac Enzymes: No results for input(s): CKTOTAL, CKMB, CKMBINDEX, TROPONINI in the last 168 hours.  BNP (last 3 results) No results for input(s): BNP in the last 8760 hours.  ProBNP (last 3 results) No results for input(s): PROBNP in the last 8760 hours.  CBG: No results for input(s): GLUCAP in the last 168 hours.  Radiological Exams on Admission: Dg Chest 2 View  02/05/2015  CLINICAL DATA:  Acute onset of left-sided chest and abdominal pain. Left shoulder pain. Diarrhea. Initial encounter. EXAM: CHEST  2 VIEW COMPARISON:  Chest radiograph from 05/16/2011 FINDINGS: The lungs are well-aerated. Mild bibasilar opacities likely reflect atelectasis. There is no evidence of pleural effusion or pneumothorax. The heart is normal in size; the mediastinal contour is within normal limits. No acute osseous abnormalities are seen. IMPRESSION: Mild bibasilar opacities likely reflect atelectasis. Lungs otherwise clear. Electronically Signed   By: Roanna Raider M.D.   On: 02/05/2015 00:49   Ct Angio Chest Aorta W/cm &/or Wo/cm  02/05/2015  CLINICAL DATA:  Acute onset of left-sided abdominal pain, radiating to the left chest and left shoulder. Diarrhea. Nausea. Initial encounter. EXAM: CT ANGIOGRAPHY CHEST, ABDOMEN AND PELVIS TECHNIQUE: Multidetector CT imaging through the chest, abdomen and pelvis was performed using the standard  protocol during bolus administration of intravenous contrast. Multiplanar reconstructed images and MIPs were obtained and reviewed to evaluate the vascular anatomy. CONTRAST:  100 mL of Omnipaque 350 IV contrast COMPARISON:  Chest radiograph performed earlier today at 12:31 a.m. FINDINGS: CTA CHEST FINDINGS There is no evidence of aortic dissection. There is no evidence of aneurysmal dilatation. No calcific atherosclerotic disease is seen. The great vessels are unremarkable in appearance. There is no evidence of pulmonary embolus. There is somewhat unusual appearing focal airspace opacity at the left lower lobe, without evidence of associated pulmonary embolus. Given underlying hazy opacity within both lower lobes and a small left pleural effusion, findings are thought to reflect mild pulmonary edema. There is no evidence of pneumothorax. No masses are identified; no abnormal focal contrast enhancement is seen. A borderline prominent precarinal node is noted, measuring 1.2 cm in short axis. No additional mediastinal lymphadenopathy is seen. No pericardial effusion is identified. No axillary lymphadenopathy is seen. The thyroid gland is unremarkable in appearance. No acute osseous abnormalities are seen. Review of the MIP images confirms the above findings. CTA ABDOMEN AND PELVIS FINDINGS There is no evidence of aortic dissection. There is no evidence of aneurysmal dilatation. Mild calcification is noted along the abdominal aorta and its branches. No luminal narrowing is seen. The celiac trunk, superior mesenteric artery, bilateral renal arteries and inferior mesenteric artery appear fully patent. The inferior vena cava is grossly unremarkable in appearance. The liver and spleen are unremarkable in appearance. The gallbladder is within normal limits. The pancreas and adrenal glands are unremarkable. A 4.4 cm cyst is noted at the upper pole of the right kidney. The kidneys are otherwise unremarkable. There is no  evidence of hydronephrosis. No renal or ureteral stones are seen. No perinephric stranding is appreciated. No free fluid is identified. The small bowel is unremarkable in appearance. The stomach is within normal limits. No acute vascular abnormalities are seen. The appendix is normal in caliber, without evidence of appendicitis. The colon is unremarkable in appearance. The bladder is mildly distended and grossly unremarkable. The prostate remains normal in size. No inguinal lymphadenopathy is seen. No acute osseous abnormalities are identified. Review of the MIP images confirms the above findings. IMPRESSION: 1. No evidence of aortic dissection. No evidence of aneurysmal dilatation. Mild calcification along the abdominal aorta and its  branches, without evidence of luminal narrowing. 2. No evidence of pulmonary embolus at this time. 3. Somewhat unusual focal airspace opacity at the left lower lobe, without evidence of associated pulmonary embolus. Given underlying hazy opacity within both lower lobes and a small left pleural effusion, findings are thought to reflect mild pulmonary edema. Alternately, this could reflect a resolved small pulmonary embolus with small pulmonary infarct, and subsequent pulmonary edema. 4. Borderline prominent precarinal node, measuring 1.2 cm in short axis, nonspecific in appearance. 5. Right renal cyst noted. Electronically Signed   By: Roanna Raider M.D.   On: 02/05/2015 02:37   Ct Angio Abd/pel W/ And/or W/o  02/05/2015  CLINICAL DATA:  Acute onset of left-sided abdominal pain, radiating to the left chest and left shoulder. Diarrhea. Nausea. Initial encounter. EXAM: CT ANGIOGRAPHY CHEST, ABDOMEN AND PELVIS TECHNIQUE: Multidetector CT imaging through the chest, abdomen and pelvis was performed using the standard protocol during bolus administration of intravenous contrast. Multiplanar reconstructed images and MIPs were obtained and reviewed to evaluate the vascular anatomy.  CONTRAST:  100 mL of Omnipaque 350 IV contrast COMPARISON:  Chest radiograph performed earlier today at 12:31 a.m. FINDINGS: CTA CHEST FINDINGS There is no evidence of aortic dissection. There is no evidence of aneurysmal dilatation. No calcific atherosclerotic disease is seen. The great vessels are unremarkable in appearance. There is no evidence of pulmonary embolus. There is somewhat unusual appearing focal airspace opacity at the left lower lobe, without evidence of associated pulmonary embolus. Given underlying hazy opacity within both lower lobes and a small left pleural effusion, findings are thought to reflect mild pulmonary edema. There is no evidence of pneumothorax. No masses are identified; no abnormal focal contrast enhancement is seen. A borderline prominent precarinal node is noted, measuring 1.2 cm in short axis. No additional mediastinal lymphadenopathy is seen. No pericardial effusion is identified. No axillary lymphadenopathy is seen. The thyroid gland is unremarkable in appearance. No acute osseous abnormalities are seen. Review of the MIP images confirms the above findings. CTA ABDOMEN AND PELVIS FINDINGS There is no evidence of aortic dissection. There is no evidence of aneurysmal dilatation. Mild calcification is noted along the abdominal aorta and its branches. No luminal narrowing is seen. The celiac trunk, superior mesenteric artery, bilateral renal arteries and inferior mesenteric artery appear fully patent. The inferior vena cava is grossly unremarkable in appearance. The liver and spleen are unremarkable in appearance. The gallbladder is within normal limits. The pancreas and adrenal glands are unremarkable. A 4.4 cm cyst is noted at the upper pole of the right kidney. The kidneys are otherwise unremarkable. There is no evidence of hydronephrosis. No renal or ureteral stones are seen. No perinephric stranding is appreciated. No free fluid is identified. The small bowel is unremarkable in  appearance. The stomach is within normal limits. No acute vascular abnormalities are seen. The appendix is normal in caliber, without evidence of appendicitis. The colon is unremarkable in appearance. The bladder is mildly distended and grossly unremarkable. The prostate remains normal in size. No inguinal lymphadenopathy is seen. No acute osseous abnormalities are identified. Review of the MIP images confirms the above findings. IMPRESSION: 1. No evidence of aortic dissection. No evidence of aneurysmal dilatation. Mild calcification along the abdominal aorta and its branches, without evidence of luminal narrowing. 2. No evidence of pulmonary embolus at this time. 3. Somewhat unusual focal airspace opacity at the left lower lobe, without evidence of associated pulmonary embolus. Given underlying hazy opacity within both lower lobes and  a small left pleural effusion, findings are thought to reflect mild pulmonary edema. Alternately, this could reflect a resolved small pulmonary embolus with small pulmonary infarct, and subsequent pulmonary edema. 4. Borderline prominent precarinal node, measuring 1.2 cm in short axis, nonspecific in appearance. 5. Right renal cyst noted. Electronically Signed   By: Roanna Raider M.D.   On: 02/05/2015 02:37     EKG: Independently reviewed.    Assessment/Plan:   54 y.o. male with  Active Problems:   1.      CAP (community acquired pneumonia)   IV Rocephin and Azithromycin   Albuterol Nebs PRN   O2 PRN   Monitor O2 sats     2.      Pericarditis- Rule out   Cards to see    Colchicine and ASA Rx        3.      Chest Pain   Cardiac Monitoring   Cycle Troponins   O2, ASA, (No Nitrates because it worsened the pain)   Cards to see in AM , Dr. Jacinto Halim     4.      G6PD Deficiency   Monitor for Signs of Hemolysis      5.      Essential HTN   Monitor BPs      6.      DVT Prophylaxis   Lovenox         Code Status:     FULL CODE     Family Communication:    Wife at Bedside       Disposition Plan:    Inpatient  Status        Time spent:  14 Minutes      Ron Parker [name] Pager [phone]   If 7AM -7PM Please Contact the Day Rounding Team MD for [name]  If 7PM-7AM, Please Contact Night-Floor Coverage  [email or web address] Password [email or web address] 02/05/2015, 4:20 AM     ADDENDUM:   Patient was seen and examined on 02/05/2015

## 2015-02-05 NOTE — ED Notes (Signed)
Pt. reports left abdominal pain radiating to left chest and left shoulder onset Tuesday this week with diarrhea , currently taking  Zithromax antibiotic for bronchitis , diagnosed with flu last week prescribed with Tamiflu.

## 2015-02-05 NOTE — ED Notes (Signed)
CT called and pt unable to lay flat on bed. Dr Ranae PalmsYelverton notified and gave verbal order for 50mcg of fentanyl. This RN in CT with pt now for fentanyl admin.

## 2015-02-05 NOTE — ED Notes (Signed)
Dr Ranae PalmsYelverton in room with patient to discuss ct results and admission plan.

## 2015-02-05 NOTE — Progress Notes (Signed)
Called ED for report.  Caryn BeeKevin, ED RN, will call back.  He is currently medicating this patient.  Owens & MinorKimberly Alie Hardgrove RN-BC, WTA.

## 2015-02-05 NOTE — Progress Notes (Signed)
Initial Nutrition Assessment  DOCUMENTATION CODES:   Non-severe (moderate) malnutrition in context of acute illness/injury  INTERVENTION:  Provide Boost Breeze po BID, each supplement provides 250 kcal and 9 grams of protein Provide Ensure Enlive po once daily, each supplement provides 350 kcal and 20 grams of protein Provide a snack once daily   NUTRITION DIAGNOSIS:   Inadequate oral intake related to acute illness as evidenced by energy intake < or equal to 50% for > or equal to 5 days, mild depletion of muscle mass, percent weight loss.   GOAL:   Patient will meet greater than or equal to 90% of their needs   MONITOR:   PO intake, Supplement acceptance, Labs, Weight trends  REASON FOR ASSESSMENT:   Malnutrition Screening Tool    ASSESSMENT:   54 y.o. male with a history of HTN, and G6PD Deficiency who presents to the ED with complaints of Left sided Chest pain and Left Shoulder and Upper ABD Pain since 9 PM . He reports having 10/10 Pleuritic Chest pain, which is worse with laying supine. He has SOB. He had been treated for the Flu and Acute Bronchitis over the past 2 weeks and has had Tamiflu followed by Azithromycin prescribed by his PCP. Found to have pneumonia on admission.  Pt reports eating very poorly over the past few weeks due to feeling sick; he has been eating 80% less than usual. He reports a usual body weight of 165 lbs and lost down to 152 lbs at home. Weight on 10/18 per chart was 156 lbs- 5% weight loss. Pt did not eat any breakfast this morning due to poor appetite but, has been drinking lots of fluids. Patient has some mild muscle wasting of temples, thighs, and scapula per physical exam. Pt agreeable to receiving nutritional supplements and snacks until appetite improves.   Labs reviewed.   Diet Order:  Diet Heart Room service appropriate?: Yes; Fluid consistency:: Thin  Skin:  Reviewed, no issues  Last BM:  10/21  Height:   Ht Readings from  Last 1 Encounters:  02/05/15 5\' 9"  (1.753 m)    Weight:   Wt Readings from Last 1 Encounters:  02/05/15 161 lb 2.5 oz (73.1 kg)    Ideal Body Weight:  72.7 kg  BMI:  Body mass index is 23.79 kg/(m^2).  Estimated Nutritional Needs:   Kcal:  1900-2100  Protein:  85-95 grams  Fluid:  2.1 L/day  EDUCATION NEEDS:   No education needs identified at this time  Dorothea Ogleeanne Lithzy Bernard RD, LDN Inpatient Clinical Dietitian Pager: [phone] After Hours Pager: [phone]

## 2015-02-05 NOTE — Progress Notes (Signed)
Patient admitted after midnight. Chart reviewed. Patient examined. Still will severe pain. Will add toradol. Cardiology does not think pericarditis.  Transfer to floor.  Crista Curborinna Flordia Kassem, MD [name]

## 2015-02-05 NOTE — Consult Note (Signed)
CARDIOLOGY CONSULT NOTE  Patient ID: Roy Patel MRN: [id number] DOB/AGE: 54/08/1960 54 y.o.  Admit date: 02/05/2015 Referring Physician: EDP, [name] Primary Physician:  Tillman Abideichard Letvak, MD Reason for Consultation: Chest pain  HPI: Roy Patel  is a 54 y.o. male with a history of hypertension and continued tobacco use who presented to the emergency department on 02/05/2015 with severe left-sided chest pain.  He states symptoms began gradually about a week ago that became severe yesterday morning.  Over the past 2 weeks, he has been treated for the flu and then bronchitis and states he does not feel completely recovered from these.  He reports intermittent nonproductive cough with fever and chills over the past 2 weeks.  No history of hyperlipidemia or diabetes.  Patient sitting upright in bed during exam because he states it hurts worse to lay down.  Patient also markedly tender in his left lateral chest and reports difficulty taking deep breaths due to pain.  Past Medical History  Diagnosis Date  . Cluster headache   . G6PD deficiency (HCC)     ?  Marland Kitchen. Hypertension      Past Surgical History  Procedure Laterality Date  . Extensor tendon of forearm / wrist repair  ~1970    Left  . Meniscus repair  1997    [place], Right knee  . Meniscus repair  2000    [place], Left knee  . Anterior cruciate ligament repair  2009    MCL removed ([facility])     Family History  Problem Relation Age of Onset  . Cancer Mother     Lung  . Diabetes Father   . Heart disease Father     CHF  . Colon polyps Brother   . Hypertension Neg Hx   . Colon polyps Brother      Social History: Social History   Social History  . Marital Status: Single    Spouse Name: N/A  . Number of Children: 1  . Years of Education: N/A   Occupational History  . Golf Maintenance at [employer]    Social History Main Topics  . Smoking status: Current Every Day Smoker -- 1.00 packs/day    Types:  Cigarettes  . Smokeless tobacco: Never Used  . Alcohol Use: 0.0 oz/week    0 Standard drinks or equivalent per week     Comment: occasional  . Drug Use: Not on file  . Sexual Activity: Not on file   Other Topics Concern  . Not on file   Social History Narrative   Married:  2nd   [place] in [place]   Step daughter here.     Prescriptions prior to admission  Medication Sig Dispense Refill Last Dose  . azithromycin (ZITHROMAX) 250 MG tablet Take 2 tabs PO x 1 dose, then 1 tab PO QD x 4 days 6 tablet 0 02/04/2015 at Unknown time  . benzonatate (TESSALON) 200 MG capsule Take 200 mg by mouth 3 (three) times daily.    02/04/2015 at Unknown time  . dextromethorphan-guaiFENesin (TUSSIN DM) 10-100 MG/5ML liquid Take 10 mLs by mouth every 4 (four) hours as needed for cough.   02/04/2015 at Unknown time  . ibuprofen (ADVIL,MOTRIN) 200 MG tablet Take 800 mg by mouth every 6 (six) hours as needed for fever or mild pain.    02/04/2015 at Unknown time  . losartan (COZAAR) 50 MG tablet Take 1 tablet (50 mg total) by mouth daily. 90 tablet 3 02/04/2015 at Unknown  time  . magnesium oxide (MAG-OX) 400 MG tablet Take 400 mg by mouth 2 (two) times daily.     . SUMAtriptan (IMITREX) 6 MG/0.5ML SOLN injection Inject 0.5 mLs (6 mg total) into the skin once. At the onset of a cluster headache. (Patient not taking: Reported on 02/05/2015) 4.5 mL 3 Not Taking at Unknown time     ROS: General: reports fevers and chills Eyes: no blurry vision, diplopia, or amaurosis ENT: no sore throat or hearing loss Resp: reports nonproductive cough; no wheezing or hemoptysis CV: reports left sided chest pain, no edema or palpitations GI: no nausea, vomiting, diarrhea, or constipation GU: no dysuria, frequency, or hematuria Skin: no rash Neuro: no headache, numbness, tingling, or weakness of extremities Musculoskeletal: no joint pain or swelling Heme: no abnormal bleeding or easy bruising Endo: no polydipsia or  polyuria  Physical Exam: Blood pressure 118/61, pulse 81, temperature 97.6 F (36.4 C), temperature source Oral, resp. rate 26, height  (1.753 m), weight 73.1 kg (161 lb 2.5 oz), SpO2 90 %.   General appearance: alert, cooperative, appears stated age and mild distress Neck: no adenopathy, no carotid bruit, no JVD, supple, symmetrical, trachea midline and thyroid not enlarged, symmetric, no tenderness/mass/nodules Lungs: clear to auscultation bilaterally Chest wall: left sided chest wall tenderness Heart: regular rate and rhythm, S1, S2 normal, no murmur, click, rub or gallop Extremities: extremities normal, atraumatic, no cyanosis or edema Pulses: 2+ and symmetric Skin: Skin color, texture, turgor normal. No rashes or lesions Neurologic: Grossly normal  Labs:   Lab Results  Component Value Date   WBC 12.6* 02/05/2015   HGB 12.6* 02/05/2015   HCT 37.2* 02/05/2015   MCV 95.9 02/05/2015   PLT 277 02/05/2015    Recent Labs Lab 02/05/15 0037 02/05/15 0109  NA 135  --   K 3.5  --   CL 101  --   CO2 23  --   BUN 8  --   CREATININE 0.73  --   CALCIUM 9.3  --   PROT  --  8.2*  BILITOT  --  0.3  ALKPHOS  --  93  ALT  --  54  AST  --  57*  GLUCOSE 98  --     Lipid Panel  No results found for: CHOL, TRIG, HDL, CHOLHDL, VLDL, LDLCALC  BNP (last 3 results) No results for input(s): BNP in the last 8760 hours.  ProBNP (last 3 results) No results for input(s): PROBNP in the last 8760 hours.  HEMOGLOBIN A1C No results found for: HGBA1C, MPG  Cardiac Panel (last 3 results)  Recent Labs  02/05/15 0645  TROPONINI <0.03    Lab Results  Component Value Date   TROPONINI <0.03 02/05/2015     TSH No results for input(s): TSH in the last 8760 hours.  EKG 02/05/2015: Sinus rhythm at a rate of 68 bpm, normal axis, normal intervals, no evidence of ischemia.   Radiology: Dg Chest 2 View  02/05/2015  CLINICAL DATA:  Acute onset of left-sided chest and abdominal  pain. Left shoulder pain. Diarrhea. Initial encounter. EXAM: CHEST  2 VIEW COMPARISON:  Chest radiograph from 05/16/2011 FINDINGS: The lungs are well-aerated. Mild bibasilar opacities likely reflect atelectasis. There is no evidence of pleural effusion or pneumothorax. The heart is normal in size; the mediastinal contour is within normal limits. No acute osseous abnormalities are seen. IMPRESSION: Mild bibasilar opacities likely reflect atelectasis. Lungs otherwise clear. Electronically Signed   By: Beryle Beams.D.  On: 02/05/2015 00:49   Ct Angio Chest Aorta W/cm &/or Wo/cm  02/05/2015  CLINICAL DATA:  Acute onset of left-sided abdominal pain, radiating to the left chest and left shoulder. Diarrhea. Nausea. Initial encounter. EXAM: CT ANGIOGRAPHY CHEST, ABDOMEN AND PELVIS TECHNIQUE: Multidetector CT imaging through the chest, abdomen and pelvis was performed using the standard protocol during bolus administration of intravenous contrast. Multiplanar reconstructed images and MIPs were obtained and reviewed to evaluate the vascular anatomy. CONTRAST:  100 mL of Omnipaque 350 IV contrast COMPARISON:  Chest radiograph performed earlier today at 12:31 a.m. FINDINGS: CTA CHEST FINDINGS There is no evidence of aortic dissection. There is no evidence of aneurysmal dilatation. No calcific atherosclerotic disease is seen. The great vessels are unremarkable in appearance. There is no evidence of pulmonary embolus. There is somewhat unusual appearing focal airspace opacity at the left lower lobe, without evidence of associated pulmonary embolus. Given underlying hazy opacity within both lower lobes and a small left pleural effusion, findings are thought to reflect mild pulmonary edema. There is no evidence of pneumothorax. No masses are identified; no abnormal focal contrast enhancement is seen. A borderline prominent precarinal node is noted, measuring 1.2 cm in short axis. No additional mediastinal lymphadenopathy  is seen. No pericardial effusion is identified. No axillary lymphadenopathy is seen. The thyroid gland is unremarkable in appearance. No acute osseous abnormalities are seen. Review of the MIP images confirms the above findings. CTA ABDOMEN AND PELVIS FINDINGS There is no evidence of aortic dissection. There is no evidence of aneurysmal dilatation. Mild calcification is noted along the abdominal aorta and its branches. No luminal narrowing is seen. The celiac trunk, superior mesenteric artery, bilateral renal arteries and inferior mesenteric artery appear fully patent. The inferior vena cava is grossly unremarkable in appearance. The liver and spleen are unremarkable in appearance. The gallbladder is within normal limits. The pancreas and adrenal glands are unremarkable. A 4.4 cm cyst is noted at the upper pole of the right kidney. The kidneys are otherwise unremarkable. There is no evidence of hydronephrosis. No renal or ureteral stones are seen. No perinephric stranding is appreciated. No free fluid is identified. The small bowel is unremarkable in appearance. The stomach is within normal limits. No acute vascular abnormalities are seen. The appendix is normal in caliber, without evidence of appendicitis. The colon is unremarkable in appearance. The bladder is mildly distended and grossly unremarkable. The prostate remains normal in size. No inguinal lymphadenopathy is seen. No acute osseous abnormalities are identified. Review of the MIP images confirms the above findings. IMPRESSION: 1. No evidence of aortic dissection. No evidence of aneurysmal dilatation. Mild calcification along the abdominal aorta and its branches, without evidence of luminal narrowing. 2. No evidence of pulmonary embolus at this time. 3. Somewhat unusual focal airspace opacity at the left lower lobe, without evidence of associated pulmonary embolus. Given underlying hazy opacity within both lower lobes and a small left pleural effusion,  findings are thought to reflect mild pulmonary edema. Alternately, this could reflect a resolved small pulmonary embolus with small pulmonary infarct, and subsequent pulmonary edema. 4. Borderline prominent precarinal node, measuring 1.2 cm in short axis, nonspecific in appearance. 5. Right renal cyst noted. Electronically Signed   By: Roanna Raider M.D.   On: 02/05/2015 02:37   Ct Angio Abd/pel W/ And/or W/o  02/05/2015  CLINICAL DATA:  Acute onset of left-sided abdominal pain, radiating to the left chest and left shoulder. Diarrhea. Nausea. Initial encounter. EXAM: CT ANGIOGRAPHY CHEST, ABDOMEN  AND PELVIS TECHNIQUE: Multidetector CT imaging through the chest, abdomen and pelvis was performed using the standard protocol during bolus administration of intravenous contrast. Multiplanar reconstructed images and MIPs were obtained and reviewed to evaluate the vascular anatomy. CONTRAST:  100 mL of Omnipaque 350 IV contrast COMPARISON:  Chest radiograph performed earlier today at 12:31 a.m. FINDINGS: CTA CHEST FINDINGS There is no evidence of aortic dissection. There is no evidence of aneurysmal dilatation. No calcific atherosclerotic disease is seen. The great vessels are unremarkable in appearance. There is no evidence of pulmonary embolus. There is somewhat unusual appearing focal airspace opacity at the left lower lobe, without evidence of associated pulmonary embolus. Given underlying hazy opacity within both lower lobes and a small left pleural effusion, findings are thought to reflect mild pulmonary edema. There is no evidence of pneumothorax. No masses are identified; no abnormal focal contrast enhancement is seen. A borderline prominent precarinal node is noted, measuring 1.2 cm in short axis. No additional mediastinal lymphadenopathy is seen. No pericardial effusion is identified. No axillary lymphadenopathy is seen. The thyroid gland is unremarkable in appearance. No acute osseous abnormalities are seen.  Review of the MIP images confirms the above findings. CTA ABDOMEN AND PELVIS FINDINGS There is no evidence of aortic dissection. There is no evidence of aneurysmal dilatation. Mild calcification is noted along the abdominal aorta and its branches. No luminal narrowing is seen. The celiac trunk, superior mesenteric artery, bilateral renal arteries and inferior mesenteric artery appear fully patent. The inferior vena cava is grossly unremarkable in appearance. The liver and spleen are unremarkable in appearance. The gallbladder is within normal limits. The pancreas and adrenal glands are unremarkable. A 4.4 cm cyst is noted at the upper pole of the right kidney. The kidneys are otherwise unremarkable. There is no evidence of hydronephrosis. No renal or ureteral stones are seen. No perinephric stranding is appreciated. No free fluid is identified. The small bowel is unremarkable in appearance. The stomach is within normal limits. No acute vascular abnormalities are seen. The appendix is normal in caliber, without evidence of appendicitis. The colon is unremarkable in appearance. The bladder is mildly distended and grossly unremarkable. The prostate remains normal in size. No inguinal lymphadenopathy is seen. No acute osseous abnormalities are identified. Review of the MIP images confirms the above findings. IMPRESSION: 1. No evidence of aortic dissection. No evidence of aneurysmal dilatation. Mild calcification along the abdominal aorta and its branches, without evidence of luminal narrowing. 2. No evidence of pulmonary embolus at this time. 3. Somewhat unusual focal airspace opacity at the left lower lobe, without evidence of associated pulmonary embolus. Given underlying hazy opacity within both lower lobes and a small left pleural effusion, findings are thought to reflect mild pulmonary edema. Alternately, this could reflect a resolved small pulmonary embolus with small pulmonary infarct, and subsequent pulmonary  edema. 4. Borderline prominent precarinal node, measuring 1.2 cm in short axis, nonspecific in appearance. 5. Right renal cyst noted. Electronically Signed   By: Roanna Raider M.D.   On: 02/05/2015 02:37    Scheduled Meds: . antiseptic oral rinse  7 mL Mouth Rinse BID  . aspirin EC  325 mg Oral Daily  . [START ON 02/06/2015] azithromycin  500 mg Intravenous Q24H  . benzonatate  200 mg Oral TID  . [START ON 02/06/2015] cefTRIAXone (ROCEPHIN)  IV  1 g Intravenous Q24H  . colchicine  0.6 mg Oral BID  . enoxaparin (LOVENOX) injection  40 mg Subcutaneous Q24H  . feeding  supplement (ENSURE ENLIVE)  237 mL Oral BID BM  . [START ON 02/06/2015] Influenza vac split quadrivalent PF  0.5 mL Intramuscular Tomorrow-1000  . losartan  50 mg Oral Daily  . magnesium oxide  400 mg Oral BID  . nicotine  14 mg Transdermal Daily  . pantoprazole (PROTONIX) IV  40 mg Intravenous Q24H   Continuous Infusions: . sodium chloride 75 mL/hr at 02/05/15 0549   PRN Meds:.acetaminophen **OR** acetaminophen, alum & mag hydroxide-simeth, HYDROmorphone (DILAUDID) injection, iohexol, ondansetron **OR** ondansetron (ZOFRAN) IV, oxyCODONE  ASSESSMENT AND PLAN:  1. Chest wall pain 2. Hypertension 3. CAP 4. G6PD deficiency  Recommendation: Patient reports two-week history of cough and flulike symptoms and has been treated for both the flu and acute bronchitis.  Patient with O2 sats in the mid-80s during exam which improved to low 90s when placed back on supplemental oxygen.  Suspect continued respiratory infection with costochondritis.  EKG and physical exam normal.  Very low suspicion for pericarditis or ACS.  Doubt colchicine will offer much benefit but would consider addition of an NSAID. No further evaluation from a cardiac standpoint is indicated at this time but we would certainly be happy to reevaluate if necessary for any new or worsening symptoms or changes in lab values or EKG.    Erling Conte,  NP-C 02/05/2015, 10:00 AM [facility] [place] Pager: [phone] Office: [phone]

## 2015-02-05 NOTE — ED Notes (Signed)
Pt back in room escorted back by this RN. BP now 134/58

## 2015-02-05 NOTE — ED Notes (Signed)
Gave pt ice water, per Caryn BeeKevin - RN

## 2015-02-05 NOTE — ED Notes (Signed)
Gave pt water, per Nehemiah SettleBrooke - RN and Caryn BeeKevin - RN

## 2015-02-05 NOTE — Progress Notes (Signed)
[facility] to 5W05 via bed; alert and oriented x 4; complaints of pain on right side of his chest and abdomen; IV saline locked in LAC and fluids running in RAC; skin intact. Orient patient to room and unit; instructed how to use the call bell and  fall risk precautions. Will continue to monitor the patient.

## 2015-02-05 NOTE — ED Provider Notes (Signed)
CSN: [id number]     Arrival date & time 02/05/15  0013 History  By signing my name below, I, Evon Slack, attest that this documentation has been prepared under the direction and in the presence of Ron Parker, MD. Electronically Signed: Evon Slack, ED Scribe. 02/05/2015. 4:00 AM.     Chief Complaint  Patient presents with  . Abdominal Pain  . Chest Pain   The history is provided by the patient. No language interpreter was used.   HPI Comments: Roy Patel is a 54 y.o. male who presents to the Emergency Department complaining of intermittent left sided chest and abdominal pain onset 3 days prior. Pt states that about 4 hours PTA the pain worsened and became constant. Pt states that cough or taking a deep breath makes the pain worse. Pt states that the pain radiates up to his left chest and left shoulder. Pt reports nausea and diarrhea earlier in the week. Pt states that he has also had decreased appetite throughout the week. Pt does report recently being treated with Tamiflu for flu like symptoms. Pt states that on Tuesday he went to his PCP and was giving Zithromax for bronchitis. Upon entering the room Pt states that he felt as if he was going to pass out. Pt denies recent fall or injury. Pt does report everyday tobacco use 20-30 years. Pt denies vomiting or other related symptoms.   Past Medical History  Diagnosis Date  . Cluster headache   . G6PD deficiency (HCC)     ?  Marland Kitchen Hypertension    Past Surgical History  Procedure Laterality Date  . Extensor tendon of forearm / wrist repair  ~1970    Left  . Meniscus repair  1997    [place], Right knee  . Meniscus repair  2000    [place], Left knee  . Anterior cruciate ligament repair  2009    MCL removed ([facility])   Family History  Problem Relation Age of Onset  . Cancer Mother     Lung  . Diabetes Father   . Heart disease Father     CHF  . Colon polyps Brother   . Hypertension Neg Hx   . Colon polyps Brother     Social History  Substance Use Topics  . Smoking status: Current Every Day Smoker -- 1.00 packs/day    Types: Cigarettes  . Smokeless tobacco: Never Used  . Alcohol Use: 0.0 oz/week    0 Standard drinks or equivalent per week     Comment: occasional    Review of Systems  Constitutional: Positive for appetite change. Negative for fever and chills.  Respiratory: Negative for cough and shortness of breath.   Cardiovascular: Positive for chest pain. Negative for palpitations and leg swelling.  Gastrointestinal: Positive for nausea, abdominal pain and diarrhea. Negative for vomiting.  Musculoskeletal: Positive for back pain. Negative for neck pain and neck stiffness.  Skin: Negative for rash and wound.  Neurological: Positive for dizziness, weakness (generalized) and light-headedness. Negative for syncope, numbness and headaches.  All other systems reviewed and are negative.     Allergies  Chlorthalidone and Sulfa antibiotics  Home Medications   Prior to Admission medications   Medication Sig Start Date End Date Taking? Authorizing Provider  azithromycin (ZITHROMAX) 250 MG tablet Take 2 tabs PO x 1 dose, then 1 tab PO QD x 4 days 02/01/15  Yes Amy E Bedsole, MD  benzonatate (TESSALON) 200 MG capsule Take 200 mg by mouth 3 (  three) times daily.  01/26/15  Yes Historical Provider, MD  dextromethorphan-guaiFENesin (TUSSIN DM) 10-100 MG/5ML liquid Take 10 mLs by mouth every 4 (four) hours as needed for cough.   Yes Historical Provider, MD  ibuprofen (ADVIL,MOTRIN) 200 MG tablet Take 800 mg by mouth every 6 (six) hours as needed for fever or mild pain.    Yes Historical Provider, MD  losartan (COZAAR) 50 MG tablet Take 1 tablet (50 mg total) by mouth daily. 05/27/14  Yes [name], MD  magnesium oxide (MAG-OX) 400 MG tablet Take 400 mg by mouth 2 (two) times daily.   Yes Historical Provider, MD  SUMAtriptan (IMITREX) 6 MG/0.5ML SOLN injection Inject 0.5 mLs (6 mg total) into the  skin once. At the onset of a cluster headache. Patient not taking: Reported on 02/05/2015 10/22/11   [name], MD   BP 124/71 mmHg  Pulse 89  Temp(Src) 98.1 F (36.7 C) (Oral)  Resp 27  SpO2 96%   Physical Exam  Constitutional: He is oriented to person, place, and time. He appears well-developed and well-nourished. He appears distressed.  HENT:  Head: Normocephalic and atraumatic.  Mouth/Throat: Oropharynx is clear and moist.  Eyes: EOM are normal. Pupils are equal, round, and reactive to light.  Neck: Normal range of motion. Neck supple.  Cardiovascular: Normal rate and regular rhythm.  Exam reveals no gallop and no friction rub.   No murmur heard. Pulmonary/Chest: Effort normal and breath sounds normal. No respiratory distress. He has no wheezes. He has no rales. He exhibits tenderness (patient has tenderness to palpation over the left chest. No crepitance or deformity.).  Abdominal: Soft. Bowel sounds are normal. He exhibits no distension and no mass. There is tenderness (tender to palpation in the left upper quadrant.). There is no rebound and no guarding.  No definite masses appreciated  Musculoskeletal: Normal range of motion. He exhibits tenderness. He exhibits no edema.  Mild tenderness in the left flank. No lower extremity swelling or pain. Questionable asymmetric dorsalis pedis pulses  Neurological: He is alert and oriented to person, place, and time.  Physical extremities without deficit. Sensation is fully intact.  Skin: Skin is warm and dry. No rash noted. No erythema.  Psychiatric:  Anxious appearing  Nursing note and vitals reviewed.   ED Course  Procedures (including critical care time) DIAGNOSTIC STUDIES: Oxygen Saturation is 97% on RA, normal by my interpretation.    COORDINATION OF CARE: 1:30 AM-Discussed treatment plan with pt at bedside and pt agreed to plan.    Labs Review Labs Reviewed  CBC - Abnormal; Notable for the following:    WBC 12.6 (*)     RBC 3.88 (*)    Hemoglobin 12.6 (*)    HCT 37.2 (*)    All other components within normal limits  URINALYSIS, ROUTINE W REFLEX MICROSCOPIC (NOT AT [facility]) - Abnormal; Notable for the following:    Color, Urine STRAW (*)    Specific Gravity, Urine 1.003 (*)    All other components within normal limits  HEPATIC FUNCTION PANEL - Abnormal; Notable for the following:    Total Protein 8.2 (*)    AST 57 (*)    Bilirubin, Direct <0.1 (*)    All other components within normal limits  BASIC METABOLIC PANEL  LIPASE, BLOOD  PROTIME-INR  APTT  I-STAT TROPOININ, ED  I-STAT TROPOININ, ED  TYPE AND SCREEN  ABO/RH    Imaging Review Dg Chest 2 View  02/05/2015  CLINICAL DATA:  Acute onset of left-sided chest and abdominal pain. Left shoulder pain. Diarrhea. Initial encounter. EXAM: CHEST  2 VIEW COMPARISON:  Chest radiograph from 05/16/2011 FINDINGS: The lungs are well-aerated. Mild bibasilar opacities likely reflect atelectasis. There is no evidence of pleural effusion or pneumothorax. The heart is normal in size; the mediastinal contour is within normal limits. No acute osseous abnormalities are seen. IMPRESSION: Mild bibasilar opacities likely reflect atelectasis. Lungs otherwise clear. Electronically Signed   By: Roanna Raider M.D.   On: 02/05/2015 00:49   Ct Angio Chest Aorta W/cm &/or Wo/cm  02/05/2015  CLINICAL DATA:  Acute onset of left-sided abdominal pain, radiating to the left chest and left shoulder. Diarrhea. Nausea. Initial encounter. EXAM: CT ANGIOGRAPHY CHEST, ABDOMEN AND PELVIS TECHNIQUE: Multidetector CT imaging through the chest, abdomen and pelvis was performed using the standard protocol during bolus administration of intravenous contrast. Multiplanar reconstructed images and MIPs were obtained and reviewed to evaluate the vascular anatomy. CONTRAST:  100 mL of Omnipaque 350 IV contrast COMPARISON:  Chest radiograph performed earlier today at 12:31 a.m. FINDINGS: CTA CHEST  FINDINGS There is no evidence of aortic dissection. There is no evidence of aneurysmal dilatation. No calcific atherosclerotic disease is seen. The great vessels are unremarkable in appearance. There is no evidence of pulmonary embolus. There is somewhat unusual appearing focal airspace opacity at the left lower lobe, without evidence of associated pulmonary embolus. Given underlying hazy opacity within both lower lobes and a small left pleural effusion, findings are thought to reflect mild pulmonary edema. There is no evidence of pneumothorax. No masses are identified; no abnormal focal contrast enhancement is seen. A borderline prominent precarinal node is noted, measuring 1.2 cm in short axis. No additional mediastinal lymphadenopathy is seen. No pericardial effusion is identified. No axillary lymphadenopathy is seen. The thyroid gland is unremarkable in appearance. No acute osseous abnormalities are seen. Review of the MIP images confirms the above findings. CTA ABDOMEN AND PELVIS FINDINGS There is no evidence of aortic dissection. There is no evidence of aneurysmal dilatation. Mild calcification is noted along the abdominal aorta and its branches. No luminal narrowing is seen. The celiac trunk, superior mesenteric artery, bilateral renal arteries and inferior mesenteric artery appear fully patent. The inferior vena cava is grossly unremarkable in appearance. The liver and spleen are unremarkable in appearance. The gallbladder is within normal limits. The pancreas and adrenal glands are unremarkable. A 4.4 cm cyst is noted at the upper pole of the right kidney. The kidneys are otherwise unremarkable. There is no evidence of hydronephrosis. No renal or ureteral stones are seen. No perinephric stranding is appreciated. No free fluid is identified. The small bowel is unremarkable in appearance. The stomach is within normal limits. No acute vascular abnormalities are seen. The appendix is normal in caliber, without  evidence of appendicitis. The colon is unremarkable in appearance. The bladder is mildly distended and grossly unremarkable. The prostate remains normal in size. No inguinal lymphadenopathy is seen. No acute osseous abnormalities are identified. Review of the MIP images confirms the above findings. IMPRESSION: 1. No evidence of aortic dissection. No evidence of aneurysmal dilatation. Mild calcification along the abdominal aorta and its branches, without evidence of luminal narrowing. 2. No evidence of pulmonary embolus at this time. 3. Somewhat unusual focal airspace opacity at the left lower lobe, without evidence of associated pulmonary embolus. Given underlying hazy opacity within both lower lobes and a small left pleural effusion, findings are thought to reflect mild pulmonary edema. Alternately, this  could reflect a resolved small pulmonary embolus with small pulmonary infarct, and subsequent pulmonary edema. 4. Borderline prominent precarinal node, measuring 1.2 cm in short axis, nonspecific in appearance. 5. Right renal cyst noted. Electronically Signed   By: Roanna RaiderJeffery  Chang M.D.   On: 02/05/2015 02:37   Ct Angio Abd/pel W/ And/or W/o  02/05/2015  CLINICAL DATA:  Acute onset of left-sided abdominal pain, radiating to the left chest and left shoulder. Diarrhea. Nausea. Initial encounter. EXAM: CT ANGIOGRAPHY CHEST, ABDOMEN AND PELVIS TECHNIQUE: Multidetector CT imaging through the chest, abdomen and pelvis was performed using the standard protocol during bolus administration of intravenous contrast. Multiplanar reconstructed images and MIPs were obtained and reviewed to evaluate the vascular anatomy. CONTRAST:  100 mL of Omnipaque 350 IV contrast COMPARISON:  Chest radiograph performed earlier today at 12:31 a.m. FINDINGS: CTA CHEST FINDINGS There is no evidence of aortic dissection. There is no evidence of aneurysmal dilatation. No calcific atherosclerotic disease is seen. The great vessels are  unremarkable in appearance. There is no evidence of pulmonary embolus. There is somewhat unusual appearing focal airspace opacity at the left lower lobe, without evidence of associated pulmonary embolus. Given underlying hazy opacity within both lower lobes and a small left pleural effusion, findings are thought to reflect mild pulmonary edema. There is no evidence of pneumothorax. No masses are identified; no abnormal focal contrast enhancement is seen. A borderline prominent precarinal node is noted, measuring 1.2 cm in short axis. No additional mediastinal lymphadenopathy is seen. No pericardial effusion is identified. No axillary lymphadenopathy is seen. The thyroid gland is unremarkable in appearance. No acute osseous abnormalities are seen. Review of the MIP images confirms the above findings. CTA ABDOMEN AND PELVIS FINDINGS There is no evidence of aortic dissection. There is no evidence of aneurysmal dilatation. Mild calcification is noted along the abdominal aorta and its branches. No luminal narrowing is seen. The celiac trunk, superior mesenteric artery, bilateral renal arteries and inferior mesenteric artery appear fully patent. The inferior vena cava is grossly unremarkable in appearance. The liver and spleen are unremarkable in appearance. The gallbladder is within normal limits. The pancreas and adrenal glands are unremarkable. A 4.4 cm cyst is noted at the upper pole of the right kidney. The kidneys are otherwise unremarkable. There is no evidence of hydronephrosis. No renal or ureteral stones are seen. No perinephric stranding is appreciated. No free fluid is identified. The small bowel is unremarkable in appearance. The stomach is within normal limits. No acute vascular abnormalities are seen. The appendix is normal in caliber, without evidence of appendicitis. The colon is unremarkable in appearance. The bladder is mildly distended and grossly unremarkable. The prostate remains normal in size. No  inguinal lymphadenopathy is seen. No acute osseous abnormalities are identified. Review of the MIP images confirms the above findings. IMPRESSION: 1. No evidence of aortic dissection. No evidence of aneurysmal dilatation. Mild calcification along the abdominal aorta and its branches, without evidence of luminal narrowing. 2. No evidence of pulmonary embolus at this time. 3. Somewhat unusual focal airspace opacity at the left lower lobe, without evidence of associated pulmonary embolus. Given underlying hazy opacity within both lower lobes and a small left pleural effusion, findings are thought to reflect mild pulmonary edema. Alternately, this could reflect a resolved small pulmonary embolus with small pulmonary infarct, and subsequent pulmonary edema. 4. Borderline prominent precarinal node, measuring 1.2 cm in short axis, nonspecific in appearance. 5. Right renal cyst noted. Electronically Signed   By: Leotis ShamesJeffery  Chang M.D.   On: 02/05/2015 02:37   I have personally reviewed and evaluated these images and lab results as part of my medical decision-making.   EKG Interpretation   Date/Time:  Saturday February 05 2015 01:40:54 EDT Ventricular Rate:  68 PR Interval:  179 QRS Duration: 89 QT Interval:  443 QTC Calculation: 471 R Axis:   69 Text Interpretation:  Sinus rhythm Consider left atrial enlargement Left  ventricular hypertrophy ST elevation suggests acute pericarditis Confirmed  by Ranae Palms  MD, Manjot Beumer ([id number]) on 02/05/2015 3:43:48 AM      MDM   Final diagnoses:  Abdominal pain  CAP (community acquired pneumonia)  Vasovagal near syncope      I, Arelia Volpe, personally performed the services described in this documentation. All medical record entries made by the scribe were at my direction and in my presence.  I have reviewed the chart and discharge instructions and agree that the record reflects my personal performance and is accurate and complete. Yelitza Reach.  02/05/2015.  4:00 AM.   Patient appeared to have a vagal episode here in the emergency department. Became mildly bradycardic and blood pressure dropped into the 90s. Complaint of lightheadedness and feeling like he was given a pass out. This has resolved.   Repeat EKG with diffuse ST segment elevation and PR depression concerning for pericarditis. Patient also has infiltrate on CT scan. Question edema versus pulmonary infarct versus infectious cause. Discussed with Dr. Lovell Sheehan area and started on antibiotics for possible infectious cause. Also spoke with Dr. Asa Lente who will consult and see the patient concerning the likely pericarditis   Loren Racer, MD 02/05/15 0400

## 2015-02-06 LAB — BASIC METABOLIC PANEL
ANION GAP: 9 (ref 5–15)
BUN: 9 mg/dL (ref 6–20)
CHLORIDE: 103 mmol/L (ref 101–111)
CO2: 21 mmol/L — AB (ref 22–32)
Calcium: 8.6 mg/dL — ABNORMAL LOW (ref 8.9–10.3)
Creatinine, Ser: 0.77 mg/dL (ref 0.61–1.24)
GFR calc non Af Amer: >60 mL/min (ref >60)
Glucose, Bld: 119 mg/dL — ABNORMAL HIGH (ref 65–99)
Potassium: 3.9 mmol/L (ref 3.5–5.1)
Sodium: 133 mmol/L — ABNORMAL LOW (ref 135–145)

## 2015-02-06 LAB — CBC
HCT: 32.7 % — ABNORMAL LOW (ref 39.0–52.0)
HEMOGLOBIN: 11 g/dL — AB (ref 13.0–17.0)
MCH: 32.4 pg (ref 26.0–34.0)
MCHC: 33.6 g/dL (ref 30.0–36.0)
MCV: 96.5 fL (ref 78.0–100.0)
Platelets: 251 10*3/uL (ref 150–400)
RBC: 3.39 MIL/uL — AB (ref 4.22–5.81)
RDW: 12.3 % (ref 11.5–15.5)
WBC: 17.4 10*3/uL — AB (ref 4.0–10.5)

## 2015-02-06 MED ORDER — DICLOFENAC SODIUM 50 MG PO TBEC
50.0000 mg | DELAYED_RELEASE_TABLET | Freq: Three times a day (TID) | ORAL | Status: DC
  Administered 2015-02-06 – 2015-02-08 (×7): 50 mg via ORAL
  Filled 2015-02-06 (×10): qty 1

## 2015-02-06 MED ORDER — DOXYCYCLINE HYCLATE 100 MG PO TABS
100.0000 mg | ORAL_TABLET | Freq: Two times a day (BID) | ORAL | Status: DC
  Administered 2015-02-06 – 2015-02-08 (×5): 100 mg via ORAL
  Filled 2015-02-06 (×5): qty 1

## 2015-02-06 NOTE — Progress Notes (Signed)
[name] PROGRESS NOTE  CAHILL FEASER XLK:[id number] DOB: Apr 03, 1961 DOA: 02/05/2015 PCP: Tillman Abide, MD  Summary 6 male presented with positional, pleuritic CP, cough, fever. CXR shows bibasilar opacities. Seen by cardiology who do not think patient has pericarditis.  Assessment/Plan:  Principal Problem:   CAP (community acquired pneumonia): WBC increasing. continue rocephin. Change azithro to doxycycline. Monitor WBC. Weaned off oxygen Active Problems:   Pleurisy: improved with toradol IV prn. Will schedule voltaren.   CIGARETTE SMOKER   G6PD deficiency (HCC)   Essential hypertension   Code Status:  full Family Communication:  wife Disposition Plan:  Home 1- 2 days if pain controlled and otherwise stable  Consultants:  Cardiology signed off  Procedures:     Antibiotics:    HPI/Subjective: Pain slightly better, but still 8/10 and has to sleep upright. Coughing up sputum  Objective: Filed Vitals:   02/06/15 0559  BP: 122/67  Pulse: 91  Temp: 99.3 F (37.4 C)  Resp: 20    Intake/Output Summary (Last 24 hours) at 02/06/15 1032 Last data filed at 02/06/15 0611  Gross per 24 hour  Intake 2647.5 ml  Output    200 ml  Net 2447.5 ml   Filed Weights   02/05/15 0524  Weight: 73.1 kg (161 lb 2.5 oz)    Exam:   General:  In chair. Appears more comfortable  Cardiovascular: RRR without MGR. Left chest less tender to palpation  Respiratory: S, NT, ND  Abdomen: no CCE  Ext: no CCE  Basic Metabolic Panel:  Recent Labs Lab 02/05/15 0037 02/06/15 0348  NA 135 133*  K 3.5 3.9  CL 101 103  CO2 23 21*  GLUCOSE 98 119*  BUN 8 9  CREATININE 0.73 0.77  CALCIUM 9.3 8.6*   Liver Function Tests:  Recent Labs Lab 02/05/15 0109  AST 57*  ALT 54  ALKPHOS 93  BILITOT 0.3  PROT 8.2*  ALBUMIN 4.0    Recent Labs Lab 02/05/15 0109  LIPASE 31   No results for input(s): AMMONIA in the last 168 hours. CBC:  Recent Labs Lab  02/05/15 0037 02/06/15 0348  WBC 12.6* 17.4*  HGB 12.6* 11.0*  HCT 37.2* 32.7*  MCV 95.9 96.5  PLT 277 251   Cardiac Enzymes:  Recent Labs Lab 02/05/15 0645  TROPONINI <0.03   BNP (last 3 results) No results for input(s): BNP in the last 8760 hours.  ProBNP (last 3 results) No results for input(s): PROBNP in the last 8760 hours.  CBG: No results for input(s): GLUCAP in the last 168 hours.  Recent Results (from the past 240 hour(s))  MRSA PCR Screening     Status: None   Collection Time: 02/05/15  6:43 AM  Result Value Ref Range Status   MRSA by PCR NEGATIVE NEGATIVE Final    Comment:        The GeneXpert MRSA Assay (FDA approved for NASAL specimens only), is one component of a comprehensive MRSA colonization surveillance program. It is not intended to diagnose MRSA infection nor to guide or monitor treatment for MRSA infections.   Culture, blood (routine x 2) Call MD if unable to obtain prior to antibiotics being given     Status: None (Preliminary result)   Collection Time: 02/05/15  6:45 AM  Result Value Ref Range Status   Specimen Description BLOOD BLOOD LEFT HAND  Final   Special Requests BOTTLES DRAWN AEROBIC AND ANAEROBIC 10CC  Final   Culture NO GROWTH 1 DAY  Final  Report Status PENDING  Incomplete  Culture, blood (routine x 2) Call MD if unable to obtain prior to antibiotics being given     Status: None (Preliminary result)   Collection Time: 02/05/15  6:50 AM  Result Value Ref Range Status   Specimen Description BLOOD BLOOD LEFT HAND  Final   Special Requests BOTTLES DRAWN AEROBIC AND ANAEROBIC 10CC  Final   Culture NO GROWTH 1 DAY  Final   Report Status PENDING  Incomplete     Studies: Dg Chest 2 View  02/05/2015  CLINICAL DATA:  Acute onset of left-sided chest and abdominal pain. Left shoulder pain. Diarrhea. Initial encounter. EXAM: CHEST  2 VIEW COMPARISON:  Chest radiograph from 05/16/2011 FINDINGS: The lungs are well-aerated. Mild bibasilar  opacities likely reflect atelectasis. There is no evidence of pleural effusion or pneumothorax. The heart is normal in size; the mediastinal contour is within normal limits. No acute osseous abnormalities are seen. IMPRESSION: Mild bibasilar opacities likely reflect atelectasis. Lungs otherwise clear. Electronically Signed   By: Roanna Raider M.D.   On: 02/05/2015 00:49   Ct Angio Chest Aorta W/cm &/or Wo/cm  02/05/2015  CLINICAL DATA:  Acute onset of left-sided abdominal pain, radiating to the left chest and left shoulder. Diarrhea. Nausea. Initial encounter. EXAM: CT ANGIOGRAPHY CHEST, ABDOMEN AND PELVIS TECHNIQUE: Multidetector CT imaging through the chest, abdomen and pelvis was performed using the standard protocol during bolus administration of intravenous contrast. Multiplanar reconstructed images and MIPs were obtained and reviewed to evaluate the vascular anatomy. CONTRAST:  100 mL of Omnipaque 350 IV contrast COMPARISON:  Chest radiograph performed earlier today at 12:31 a.m. FINDINGS: CTA CHEST FINDINGS There is no evidence of aortic dissection. There is no evidence of aneurysmal dilatation. No calcific atherosclerotic disease is seen. The great vessels are unremarkable in appearance. There is no evidence of pulmonary embolus. There is somewhat unusual appearing focal airspace opacity at the left lower lobe, without evidence of associated pulmonary embolus. Given underlying hazy opacity within both lower lobes and a small left pleural effusion, findings are thought to reflect mild pulmonary edema. There is no evidence of pneumothorax. No masses are identified; no abnormal focal contrast enhancement is seen. A borderline prominent precarinal node is noted, measuring 1.2 cm in short axis. No additional mediastinal lymphadenopathy is seen. No pericardial effusion is identified. No axillary lymphadenopathy is seen. The thyroid gland is unremarkable in appearance. No acute osseous abnormalities are seen.  Review of the MIP images confirms the above findings. CTA ABDOMEN AND PELVIS FINDINGS There is no evidence of aortic dissection. There is no evidence of aneurysmal dilatation. Mild calcification is noted along the abdominal aorta and its branches. No luminal narrowing is seen. The celiac trunk, superior mesenteric artery, bilateral renal arteries and inferior mesenteric artery appear fully patent. The inferior vena cava is grossly unremarkable in appearance. The liver and spleen are unremarkable in appearance. The gallbladder is within normal limits. The pancreas and adrenal glands are unremarkable. A 4.4 cm cyst is noted at the upper pole of the right kidney. The kidneys are otherwise unremarkable. There is no evidence of hydronephrosis. No renal or ureteral stones are seen. No perinephric stranding is appreciated. No free fluid is identified. The small bowel is unremarkable in appearance. The stomach is within normal limits. No acute vascular abnormalities are seen. The appendix is normal in caliber, without evidence of appendicitis. The colon is unremarkable in appearance. The bladder is mildly distended and grossly unremarkable. The prostate remains normal in  size. No inguinal lymphadenopathy is seen. No acute osseous abnormalities are identified. Review of the MIP images confirms the above findings. IMPRESSION: 1. No evidence of aortic dissection. No evidence of aneurysmal dilatation. Mild calcification along the abdominal aorta and its branches, without evidence of luminal narrowing. 2. No evidence of pulmonary embolus at this time. 3. Somewhat unusual focal airspace opacity at the left lower lobe, without evidence of associated pulmonary embolus. Given underlying hazy opacity within both lower lobes and a small left pleural effusion, findings are thought to reflect mild pulmonary edema. Alternately, this could reflect a resolved small pulmonary embolus with small pulmonary infarct, and subsequent pulmonary  edema. 4. Borderline prominent precarinal node, measuring 1.2 cm in short axis, nonspecific in appearance. 5. Right renal cyst noted. Electronically Signed   By: Roanna Raider M.D.   On: 02/05/2015 02:37   Ct Angio Abd/pel W/ And/or W/o  02/05/2015  CLINICAL DATA:  Acute onset of left-sided abdominal pain, radiating to the left chest and left shoulder. Diarrhea. Nausea. Initial encounter. EXAM: CT ANGIOGRAPHY CHEST, ABDOMEN AND PELVIS TECHNIQUE: Multidetector CT imaging through the chest, abdomen and pelvis was performed using the standard protocol during bolus administration of intravenous contrast. Multiplanar reconstructed images and MIPs were obtained and reviewed to evaluate the vascular anatomy. CONTRAST:  100 mL of Omnipaque 350 IV contrast COMPARISON:  Chest radiograph performed earlier today at 12:31 a.m. FINDINGS: CTA CHEST FINDINGS There is no evidence of aortic dissection. There is no evidence of aneurysmal dilatation. No calcific atherosclerotic disease is seen. The great vessels are unremarkable in appearance. There is no evidence of pulmonary embolus. There is somewhat unusual appearing focal airspace opacity at the left lower lobe, without evidence of associated pulmonary embolus. Given underlying hazy opacity within both lower lobes and a small left pleural effusion, findings are thought to reflect mild pulmonary edema. There is no evidence of pneumothorax. No masses are identified; no abnormal focal contrast enhancement is seen. A borderline prominent precarinal node is noted, measuring 1.2 cm in short axis. No additional mediastinal lymphadenopathy is seen. No pericardial effusion is identified. No axillary lymphadenopathy is seen. The thyroid gland is unremarkable in appearance. No acute osseous abnormalities are seen. Review of the MIP images confirms the above findings. CTA ABDOMEN AND PELVIS FINDINGS There is no evidence of aortic dissection. There is no evidence of aneurysmal dilatation.  Mild calcification is noted along the abdominal aorta and its branches. No luminal narrowing is seen. The celiac trunk, superior mesenteric artery, bilateral renal arteries and inferior mesenteric artery appear fully patent. The inferior vena cava is grossly unremarkable in appearance. The liver and spleen are unremarkable in appearance. The gallbladder is within normal limits. The pancreas and adrenal glands are unremarkable. A 4.4 cm cyst is noted at the upper pole of the right kidney. The kidneys are otherwise unremarkable. There is no evidence of hydronephrosis. No renal or ureteral stones are seen. No perinephric stranding is appreciated. No free fluid is identified. The small bowel is unremarkable in appearance. The stomach is within normal limits. No acute vascular abnormalities are seen. The appendix is normal in caliber, without evidence of appendicitis. The colon is unremarkable in appearance. The bladder is mildly distended and grossly unremarkable. The prostate remains normal in size. No inguinal lymphadenopathy is seen. No acute osseous abnormalities are identified. Review of the MIP images confirms the above findings. IMPRESSION: 1. No evidence of aortic dissection. No evidence of aneurysmal dilatation. Mild calcification along the abdominal aorta and its  branches, without evidence of luminal narrowing. 2. No evidence of pulmonary embolus at this time. 3. Somewhat unusual focal airspace opacity at the left lower lobe, without evidence of associated pulmonary embolus. Given underlying hazy opacity within both lower lobes and a small left pleural effusion, findings are thought to reflect mild pulmonary edema. Alternately, this could reflect a resolved small pulmonary embolus with small pulmonary infarct, and subsequent pulmonary edema. 4. Borderline prominent precarinal node, measuring 1.2 cm in short axis, nonspecific in appearance. 5. Right renal cyst noted. Electronically Signed   By: Roanna Raider  M.D.   On: 02/05/2015 02:37    Scheduled Meds: . antiseptic oral rinse  7 mL Mouth Rinse BID  . benzonatate  200 mg Oral TID  . cefTRIAXone (ROCEPHIN)  IV  1 g Intravenous Q24H  . diclofenac  50 mg Oral TID AC  . diphenhydrAMINE  50 mg Oral QHS  . doxycycline  100 mg Oral Q12H  . enoxaparin (LOVENOX) injection  40 mg Subcutaneous Q24H  . feeding supplement  1 Container Oral BID BM  . feeding supplement (ENSURE ENLIVE)  237 mL Oral Q24H  . Influenza vac split quadrivalent PF  0.5 mL Intramuscular Tomorrow-1000  . losartan  50 mg Oral Daily  . magnesium oxide  400 mg Oral BID  . multivitamin with minerals  1 tablet Oral Daily  . nicotine  14 mg Transdermal Daily   Continuous Infusions:   Time spent: 25 minutes  Zayd Bonet L  [name] [email or web address], password [email or web address] 02/06/2015, 10:32 AM  LOS: 1 day

## 2015-02-07 DIAGNOSIS — I10 Essential (primary) hypertension: Secondary | ICD-10-CM

## 2015-02-07 DIAGNOSIS — R091 Pleurisy: Secondary | ICD-10-CM

## 2015-02-07 DIAGNOSIS — J189 Pneumonia, unspecified organism: Principal | ICD-10-CM

## 2015-02-07 DIAGNOSIS — D550 Anemia due to glucose-6-phosphate dehydrogenase [G6PD] deficiency: Secondary | ICD-10-CM

## 2015-02-07 DIAGNOSIS — F17200 Nicotine dependence, unspecified, uncomplicated: Secondary | ICD-10-CM

## 2015-02-07 DIAGNOSIS — D72829 Elevated white blood cell count, unspecified: Secondary | ICD-10-CM

## 2015-02-07 LAB — CBC
HCT: 32.7 % — ABNORMAL LOW (ref 39.0–52.0)
Hemoglobin: 11 g/dL — ABNORMAL LOW (ref 13.0–17.0)
MCH: 32.5 pg (ref 26.0–34.0)
MCHC: 33.6 g/dL (ref 30.0–36.0)
MCV: 96.7 fL (ref 78.0–100.0)
PLATELETS: 252 10*3/uL (ref 150–400)
RBC: 3.38 MIL/uL — AB (ref 4.22–5.81)
RDW: 12.4 % (ref 11.5–15.5)
WBC: 15 10*3/uL — AB (ref 4.0–10.5)

## 2015-02-07 LAB — LEGIONELLA PNEUMOPHILA SEROGP 1 UR AG: L. pneumophila Serogp 1 Ur Ag: NEGATIVE

## 2015-02-07 NOTE — Progress Notes (Signed)
PROGRESS NOTE    Roy Patel UEA:[id number] DOB: [birth year] DOA: 02/05/2015 PCP: Tillman Abide, MD  HPI/Brief narrative 54 year old male with history of HTN, ongoing tobacco abuse & G6PD deficiency presented to ED on 02/05/15 with complaints of left-sided pleuritic type chest pain, nonproductive cough, fever, chills and dyspnea. He had been treated for flu and acute bronchitis with Tamiflu and azithromycin, over the past 2 weeks PTA. CTA chest was negative for PE. Treating for community-acquired pneumonia with pleurisy. Cardiology consulted and did not feel that the chest pain was cardiac of origin.   Assessment/Plan:  Community-acquired pneumonia-left lower lobe, with pleurisy - Treated empirically with IV Rocephin, initially azithromycin which was changed to doxycycline. - Clinically improving: Defervesced, leukocytosis decreasing and starting to feel better. - Continue scheduled diclofenac PO for pleuritic chest pain which may take a few days to resolve - Recommend follow-up chest x-ray +/- CT chest to ensure resolution of pneumonia findings. - Urine strep pneumo antigen: Negative, urine Legionella antigen: Negative, blood cultures 2: Negative to date, HIV antibody screen: Nonreactive  Acute respiratory failure with hypoxia - Secondary to pneumonia. Resolved  Tobacco abuse - Cessation counseled  Essential hypertension - Controlled  G6PD deficiency  Anemia - Stable. Outpatient follow-up   DVT prophylaxis: Lovenox Code Status: Full Family Communication: Discussed with spouse at bedside on 10/24 Disposition Plan: DC home possibly 10/25   Consultants:  Cardiology  Procedures:  None  Antibiotics:  IV Rocephin 10/21 >  Azithromycin 10/21-10/22  Doxycycline 10/23 >  Subjective: States that overall he is feeling better. Still has intermittent left sided pleuritic chest pain which worsens to 10/10 with episodic hacking nonproductive cough. Dyspnea improved.  Appetite improving.  Objective: Filed Vitals:   02/06/15 2035 02/07/15 0001 02/07/15 0453 02/07/15 1332  BP: 124/72 124/77 128/80 119/66  Pulse: 94 82 93 86  Temp: 99 F (37.2 C) 98 F (36.7 C) 98.7 F (37.1 C) 98.4 F (36.9 C)  TempSrc: Oral Oral Oral Oral  Resp: 18 18 18 18   Height:      Weight:      SpO2: 95% 92% 94% 97%    Intake/Output Summary (Last 24 hours) at 02/07/15 1624 Last data filed at 02/07/15 1307  Gross per 24 hour  Intake    950 ml  Output   1000 ml  Net    -50 ml   Filed Weights   02/05/15 0524  Weight: 73.1 kg (161 lb 2.5 oz)     Exam:  General exam: Pleasant young male sitting up at edge of bed comfortably without distress Respiratory system: Clear. No increased work of breathing. Cardiovascular system: S1 & S2 heard, RRR. No JVD, murmurs, gallops, clicks or pedal edema. Gastrointestinal system: Abdomen is nondistended, soft and nontender. Normal bowel sounds heard. Central nervous system: Alert and oriented. No focal neurological deficits. Extremities: Symmetric 5 x 5 power.   Data Reviewed: Basic Metabolic Panel:  Recent Labs Lab 02/05/15 0037 02/06/15 0348  NA 135 133*  K 3.5 3.9  CL 101 103  CO2 23 21*  GLUCOSE 98 119*  BUN 8 9  CREATININE 0.73 0.77  CALCIUM 9.3 8.6*   Liver Function Tests:  Recent Labs Lab 02/05/15 0109  AST 57*  ALT 54  ALKPHOS 93  BILITOT 0.3  PROT 8.2*  ALBUMIN 4.0    Recent Labs Lab 02/05/15 0109  LIPASE 31   No results for input(s): AMMONIA in the last 168 hours. CBC:  Recent Labs Lab 02/05/15 0037 02/06/15  1610 02/07/15 0518  WBC 12.6* 17.4* 15.0*  HGB 12.6* 11.0* 11.0*  HCT 37.2* 32.7* 32.7*  MCV 95.9 96.5 96.7  PLT 277 251 252   Cardiac Enzymes:  Recent Labs Lab 02/05/15 0645  TROPONINI <0.03   BNP (last 3 results) No results for input(s): PROBNP in the last 8760 hours. CBG: No results for input(s): GLUCAP in the last 168 hours.  Recent Results (from the past 240  hour(s))  MRSA PCR Screening     Status: None   Collection Time: 02/05/15  6:43 AM  Result Value Ref Range Status   MRSA by PCR NEGATIVE NEGATIVE Final    Comment:        The GeneXpert MRSA Assay (FDA approved for NASAL specimens only), is one component of a comprehensive MRSA colonization surveillance program. It is not intended to diagnose MRSA infection nor to guide or monitor treatment for MRSA infections.   Culture, blood (routine x 2) Call MD if unable to obtain prior to antibiotics being given     Status: None (Preliminary result)   Collection Time: 02/05/15  6:45 AM  Result Value Ref Range Status   Specimen Description BLOOD BLOOD LEFT HAND  Final   Special Requests BOTTLES DRAWN AEROBIC AND ANAEROBIC 10CC  Final   Culture NO GROWTH 2 DAYS  Final   Report Status PENDING  Incomplete  Culture, blood (routine x 2) Call MD if unable to obtain prior to antibiotics being given     Status: None (Preliminary result)   Collection Time: 02/05/15  6:50 AM  Result Value Ref Range Status   Specimen Description BLOOD BLOOD LEFT HAND  Final   Special Requests BOTTLES DRAWN AEROBIC AND ANAEROBIC 10CC  Final   Culture NO GROWTH 2 DAYS  Final   Report Status PENDING  Incomplete         Studies: No results found.      Scheduled Meds: . antiseptic oral rinse  7 mL Mouth Rinse BID  . benzonatate  200 mg Oral TID  . cefTRIAXone (ROCEPHIN)  IV  1 g Intravenous Q24H  . diclofenac  50 mg Oral TID AC  . diphenhydrAMINE  50 mg Oral QHS  . doxycycline  100 mg Oral Q12H  . enoxaparin (LOVENOX) injection  40 mg Subcutaneous Q24H  . feeding supplement  1 Container Oral BID BM  . feeding supplement (ENSURE ENLIVE)  237 mL Oral Q24H  . losartan  50 mg Oral Daily  . magnesium oxide  400 mg Oral BID  . multivitamin with minerals  1 tablet Oral Daily  . nicotine  14 mg Transdermal Daily   Continuous Infusions:   Principal Problem:   CAP (community acquired pneumonia) Active  Problems:   CIGARETTE SMOKER   G6PD deficiency (HCC)   Pleurisy   Essential hypertension    Time spent: 20 minutes.    Marcellus Scott, MD, FACP, FHM. [name] Pager [phone]  If 7PM-7AM, please contact night-coverage [email or web address] Password [email or web address] 02/07/2015, 4:24 PM    LOS: 2 days

## 2015-02-07 NOTE — Care Management Note (Signed)
Case Management Note  Patient Details  Name: Roy Patel MRN: [id number] Date of Birth: 09/23/1960  Subjective/Objective:                  Date-02-07-15 Initial Assessment  Spoke with patient at the bedside along with wife. Introduced self as Sports coachcase manager and explained role in discharge planning and how to be reached.   Verified patient anticipates to go home with spouse,  at time of discharge and will havepart-time supervision. Patient has no DME. Expressed potential need for no other DME.  Patient denied  needing help with their medication.  Patient is driven by wife to MD appointments.  Verified patient has PCP. Patient states they currently receive HH services through no one.    Plan: CM will continue to follow [facility] discharge planning [facility].   Lawerance Sabalebbie Evie Croston RN BSN CM [phone]   Action/Plan:   Expected Discharge Date:                  Expected Discharge Plan:  Home/Self Care  In-House Referral:     Discharge planning Services  CM Consult  Post Acute Care Choice:    Choice offered to:     DME Arranged:    DME Agency:     HH Arranged:    HH Agency:     Status of Service:  In process, will continue to follow  Medicare Important Message Given:    Date Medicare IM Given:    Medicare IM give by:    Date Additional Medicare IM Given:    Additional Medicare Important Message give by:     If discussed at Long Length of Stay Meetings, dates discussed:    Additional Comments:  Lawerance SabalDebbie Mckinzie Saksa, RN 02/07/2015, 2:53 PM

## 2015-02-08 MED ORDER — DOXYCYCLINE HYCLATE 100 MG PO TABS: 100.0000 mg | ORAL_TABLET | Freq: Two times a day (BID) | ORAL | Status: DC

## 2015-02-08 MED ORDER — NICOTINE 14 MG/24HR TD PT24: 14.0000 mg | MEDICATED_PATCH | Freq: Every day | TRANSDERMAL | Status: DC

## 2015-02-08 MED ORDER — DICLOFENAC SODIUM 50 MG PO TBEC: 50.0000 mg | DELAYED_RELEASE_TABLET | Freq: Three times a day (TID) | ORAL | Status: DC | PRN

## 2015-02-08 NOTE — Progress Notes (Signed)
Pt discharge teaching given, including activity, diet, follow-up appointments, and medications. Pt verbalized understanding of all instructions. IV access was d/c'd. VSS. Skin intact except as charted in most recent assessments. PT to be escorted out by NT, to be driven home by family. Mickelle Goupil A Rickard Kennerly, RN  

## 2015-02-08 NOTE — Care Management Note (Signed)
Case Management Note  Patient Details  Name: Roy Patel MRN: [id number] Date of Birth: 02/11/1961  Subjective/Objective:                  Date-02-07-15 Initial Assessment  Spoke with patient at the bedside along with wife. Introduced self as Sports coachcase manager and explained role in discharge planning and how to be reached.   Verified patient anticipates to go home with spouse, at time of discharge and will havepart-time supervision. Patient has no DME. Expressed potential need for no other DME.  Patient denied needing help with their medication.  Patient is driven by wife to MD appointments.  Verified patient has PCP. Patient states they currently receive HH services through no one.    Plan: CM will continue to follow [facility].   Lawerance Sabalebbie Joslyn Ramos RN BSN CM [phone]     Action/Plan:  DC to home, independent. No CM needs identified.   Expected Discharge Date:                  Expected Discharge Plan:  Home/Self Care  In-House Referral:     Discharge planning Services  CM Consult  Post Acute Care Choice:    Choice offered to:     DME Arranged:    DME Agency:     HH Arranged:    HH Agency:     Status of Service:  Completed, signed off  Medicare Important Message Given:    Date Medicare IM Given:    Medicare IM give by:    Date Additional Medicare IM Given:    Additional Medicare Important Message give by:     If discussed at Long Length of Stay Meetings, dates discussed:    Additional Comments:  Lawerance SabalDebbie Erek Kowal, RN 02/08/2015, 10:47 AM

## 2015-02-08 NOTE — Discharge Instructions (Signed)
Community-Acquired Pneumonia, Adult °Pneumonia is an infection of the lungs. There are different types of pneumonia. One type can develop while a person is in a hospital. A different type, called community-acquired pneumonia, develops in people who are not, or have not recently been, in the hospital or other health care facility.  °CAUSES °Pneumonia may be caused by bacteria, viruses, or funguses. Community-acquired pneumonia is often caused by Streptococcus pneumonia bacteria. These bacteria are often passed from one person to another by breathing in droplets from the cough or sneeze of an infected person. °RISK FACTORS °The condition is more likely to develop in: °· People who have chronic diseases, such as chronic obstructive pulmonary disease (COPD), asthma, congestive heart failure, cystic fibrosis, diabetes, or kidney disease. °· People who have early-stage or late-stage HIV. °· People who have sickle cell disease. °· People who have had their spleen removed (splenectomy). °· People who have poor dental hygiene. °· People who have medical conditions that increase the risk of breathing in (aspirating) secretions their own mouth and nose.   °· People who have a weakened immune system (immunocompromised). °· People who smoke. °· People who travel to areas where pneumonia-causing germs commonly exist. °· People who are around animal habitats or animals that have pneumonia-causing germs, including birds, bats, rabbits, cats, and farm animals. °SYMPTOMS °Symptoms of this condition include: °· A dry cough. °· A wet (productive) cough. °· Fever. °· Sweating. °· Chest pain, especially when breathing deeply or coughing. °· Rapid breathing or difficulty breathing. °· Shortness of breath. °· Shaking chills. °· Fatigue. °· Muscle aches. °DIAGNOSIS °Your health care provider will take a medical history and perform a physical exam. You may also have other tests, including: °· Imaging studies of your chest, including  X-rays. °· Tests to check your blood oxygen level and other blood gases. °· Other tests on blood, mucus (sputum), fluid around your lungs (pleural fluid), and urine. °If your pneumonia is severe, other tests may be done to identify the specific cause of your illness. °TREATMENT °The type of treatment that you receive depends on many factors, such as the cause of your pneumonia, the medicines you take, and other medical conditions that you have. For most adults, treatment and recovery from pneumonia may occur at home. In some cases, treatment must happen in a hospital. Treatment may include: °· Antibiotic medicines, if the pneumonia was caused by bacteria. °· Antiviral medicines, if the pneumonia was caused by a virus. °· Medicines that are given by mouth or through an IV tube. °· Oxygen. °· Respiratory therapy. °Although rare, treating severe pneumonia may include: °· Mechanical ventilation. This is done if you are not breathing well on your own and you cannot maintain a safe blood oxygen level. °· Thoracentesis. This procedure removes fluid around one lung or both lungs to help you breathe better. °HOME CARE INSTRUCTIONS °· Take over-the-counter and prescription medicines only as told by your health care provider. °¨ Only take cough medicine if you are losing sleep. Understand that cough medicine can prevent your body's natural ability to remove mucus from your lungs. °¨ If you were prescribed an antibiotic medicine, take it as told by your health care provider. Do not stop taking the antibiotic even if you start to feel better. °· Sleep in a semi-upright position at night. Try sleeping in a reclining chair, or place a few pillows under your head. °· Do not use tobacco products, including cigarettes, chewing tobacco, and e-cigarettes. If you need help quitting, ask your health care provider. °· Drink enough water to keep your urine   clear or pale yellow. This will help to thin out mucus secretions in your  lungs. PREVENTION There are ways that you can decrease your risk of developing community-acquired pneumonia. Consider getting a pneumococcal vaccine if:  You are older than 54 years of age.  You are older than 54 years of age and are undergoing cancer treatment, have chronic lung disease, or have other medical conditions that affect your immune system. Ask your health care provider if this applies to you. There are different types and schedules of pneumococcal vaccines. Ask your health care provider which vaccination option is best for you. You may also prevent community-acquired pneumonia if you take these actions:  Get an influenza vaccine every year. Ask your health care provider which type of influenza vaccine is best for you.  Go to the dentist on a regular basis.  Wash your hands often. Use hand sanitizer if soap and water are not available. SEEK MEDICAL CARE IF:  You have a fever.  You are losing sleep because you cannot control your cough with cough medicine. SEEK IMMEDIATE MEDICAL CARE IF:  You have worsening shortness of breath.  You have increased chest pain.  Your sickness becomes worse, especially if you are an older adult or have a weakened immune system.  You cough up blood.   This information is not intended to replace advice given to you by your health care provider. Make sure you discuss any questions you have with your health care provider.   Document Released: 04/02/2005 Document Revised: 12/22/2014 Document Reviewed: 07/28/2014 Elsevier Interactive Patient Education 2016 [employer]  Pleurisy Pleurisy is an inflammation and swelling of the lining of the lungs (pleura). Because of this inflammation, it hurts to breathe. It can be aggravated by coughing, laughing, or deep breathing. Pleurisy is often caused by an underlying infection or disease.  HOME CARE INSTRUCTIONS  Monitor your pleurisy for any changes. The following actions may help to alleviate any  discomfort you are experiencing:  Medicine may help with pain. Only take over-the-counter or prescription medicines for pain, discomfort, or fever as directed by your health care provider.  Only take antibiotic medicine as directed. Make sure to finish it even if you start to feel better. SEEK MEDICAL CARE IF:   Your pain is not controlled with medicine or is increasing.  You have an increase in pus-like (purulent) secretions brought up with coughing. SEEK IMMEDIATE MEDICAL CARE IF:   You have blue or dark lips, fingernails, or toenails.  You are coughing up blood.  You have increased difficulty breathing.  You have continuing pain unrelieved by medicine or pain lasting more than 1 week.  You have pain that radiates into your neck, arms, or jaw.  You develop increased shortness of breath or wheezing.  You develop a fever, rash, vomiting, fainting, or other serious symptoms. MAKE SURE YOU:  Understand these instructions.   Will watch your condition.   Will get help right away if you are not doing well or get worse.    This information is not intended to replace advice given to you by your health care provider. Make sure you discuss any questions you have with your health care provider.   Document Released: 04/02/2005 Document Revised: 12/03/2012 Document Reviewed: 09/14/2012 Elsevier Interactive Patient Education [employer] Else[employer] Inc.

## 2015-02-08 NOTE — Discharge Summary (Addendum)
Physician Discharge Summary  LAVON BOTHWELL ZOX:[id number] DOB: 1961/04/14 DOA: 02/05/2015  PCP: Tillman Abide, MD  Admit date: 02/05/2015 Discharge date: 02/08/2015  Time spent: Greater than 30 minutes  Recommendations for Outpatient Follow-up:  1. Dr. Tillman Abide, PCP: Patient has made an appointment to be seen on 02/10/15 at 2 PM. To be seen with repeat labs (CBC & BMP). Please follow final blood culture results that were sent from the hospital. 2. Recommend follow-up chest x-ray +/- CT chest in 3-4 weeks to ensure resolution of pneumonia findings.  Discharge Diagnoses:  Principal Problem:   CAP (community acquired pneumonia) Active Problems:   CIGARETTE SMOKER   G6PD deficiency (HCC)   Pleurisy   Essential hypertension   Discharge Condition: Improved & Stable  Diet recommendation: Heart healthy diet.  Filed Weights   02/05/15 0524  Weight: 73.1 kg (161 lb 2.5 oz)    History of present illness:  54 year old male with history of HTN, ongoing tobacco abuse & G6PD deficiency presented to ED on 02/05/15 with complaints of left-sided pleuritic type chest pain, nonproductive cough, fever, chills and dyspnea. He had been treated for flu and acute bronchitis with Tamiflu and azithromycin, over the past 2 weeks PTA. CTA chest was negative for PE. Treating for community-acquired pneumonia with pleurisy. Cardiology consulted and did not feel that the chest pain was cardiac of origin.  Hospital Course:   Community-acquired pneumonia-left lower lobe, with pleurisy - Treated empirically with IV Rocephin, initially azithromycin which was changed to doxycycline. - Clinically improving: Defervesced, leukocytosis decreasing and starting to feel better. - Initially treated with scheduled diclofenac for pleuritic chest pain. Chest pain has significantly improved. Patient and spouse have been counseled that he may use the diclofenac as needed and briefly and they verbalize understanding. -  Recommend follow-up chest x-ray +/- CT chest to ensure resolution of pneumonia findings. - Urine strep pneumo antigen: Negative, urine Legionella antigen: Negative, blood cultures 2: Negative to date, HIV antibody screen: Nonreactive - Patient has completed 4 days of IV Rocephin. As discussed with pharmacy, we will discharge on oral doxycycline to complete a total 7 days treatment.  Acute respiratory failure with hypoxia - Secondary to pneumonia. Resolved  Tobacco abuse - Cessation counseled. Continue nicotine patch.  Essential hypertension - Controlled on ARB  G6PD deficiency  Anemia - Stable. Outpatient follow-up. Unclear etiology. No recent baseline hemoglobin in system.? Secondary to acute illness. Outpatient evaluation and follow-up as needed.    Family Communication: Discussed with spouse at bedside on 10/25    Consultants:  Cardiology  Procedures:  None  Antibiotics:  IV Rocephin 10/21 >  Azithromycin 10/21-10/22  Doxycycline 10/23 >   Discharge Exam:  Complaints: Feels much better. Denies cough or dyspnea. Left-sided pleuritic chest pain has significantly improved.  Filed Vitals:   02/07/15 1332 02/07/15 2034 02/08/15 0034 02/08/15 0436  BP: 119/66 140/78 145/90 120/65  Pulse: 86 81 73 82  Temp: 98.4 F (36.9 C) 99.8 F (37.7 C) 97.6 F (36.4 C) 98.6 F (37 C)  TempSrc: Oral Oral Oral Oral  Resp: 18 18 18 18   Height:      Weight:      SpO2: 97% 98% 95% 100%    General exam: Pleasant young male seen ambulating comfortably in the room. Respiratory system: Reduced breath sounds in the left base with occasional crackles. No clear pleural rub appreciated. No increased work of breathing. Cardiovascular system: S1 & S2 heard, RRR. No JVD, murmurs, gallops, clicks or pedal edema.  Gastrointestinal system: Abdomen is nondistended, soft and nontender. Normal bowel sounds heard. Central nervous system: Alert and oriented. No focal neurological  deficits. Extremities: Symmetric 5 x 5 power.  Discharge Instructions      Discharge Instructions    Call MD for:  difficulty breathing, headache or visual disturbances    Complete by:  As directed      Call MD for:  extreme fatigue    Complete by:  As directed      Call MD for:  persistant dizziness or light-headedness    Complete by:  As directed      Call MD for:  persistant nausea and vomiting    Complete by:  As directed      Call MD for:  severe uncontrolled pain    Complete by:  As directed      Call MD for:  temperature >100.4    Complete by:  As directed      Diet - low sodium heart healthy    Complete by:  As directed      Increase activity slowly    Complete by:  As directed             Medication List    STOP taking these medications        azithromycin 250 MG tablet  Commonly known as:  ZITHROMAX     ibuprofen 200 MG tablet  Commonly known as:  ADVIL,MOTRIN     SUMAtriptan 6 MG/0.5ML Soln injection  Commonly known as:  IMITREX      TAKE these medications        benzonatate 200 MG capsule  Commonly known as:  TESSALON  Take 200 mg by mouth 3 (three) times daily.     diclofenac 50 MG EC tablet  Commonly known as:  VOLTAREN  Take 1 tablet (50 mg total) by mouth 3 (three) times daily with meals as needed for mild pain or moderate pain.     doxycycline 100 MG tablet  Commonly known as:  VIBRA-TABS  Take 1 tablet (100 mg total) by mouth 2 (two) times daily.     losartan 50 MG tablet  Commonly known as:  COZAAR  Take 1 tablet (50 mg total) by mouth daily.     magnesium oxide 400 MG tablet  Commonly known as:  MAG-OX  Take 400 mg by mouth 2 (two) times daily.     nicotine 14 mg/24hr patch  Commonly known as:  NICODERM CQ - dosed in mg/24 hours  Place 1 patch (14 mg total) onto the skin daily.     TUSSIN DM 10-100 MG/5ML liquid  Generic drug:  dextromethorphan-guaiFENesin  Take 10 mLs by mouth every 4 (four) hours as needed for cough.        Follow-up Information    Follow up with Tillman Abide, MD On 02/10/2015.   Specialties:  Internal Medicine, Pediatrics   Why:  at 2 PM. To be seen with repeat labs (CBC & BMP).   Contact information:   [address] [place] [address] [phone]        The results of significant diagnostics from this hospitalization (including imaging, microbiology, ancillary and laboratory) are listed below for reference.    Significant Diagnostic Studies: Dg Chest 2 View  02/05/2015  CLINICAL DATA:  Acute onset of left-sided chest and abdominal pain. Left shoulder pain. Diarrhea. Initial encounter. EXAM: CHEST  2 VIEW COMPARISON:  Chest radiograph from 05/16/2011 FINDINGS: The lungs are well-aerated.  Mild bibasilar opacities likely reflect atelectasis. There is no evidence of pleural effusion or pneumothorax. The heart is normal in size; the mediastinal contour is within normal limits. No acute osseous abnormalities are seen. IMPRESSION: Mild bibasilar opacities likely reflect atelectasis. Lungs otherwise clear. Electronically Signed   By: Roanna RaiderJeffery  Chang M.D.   On: 02/05/2015 00:49   Ct Angio Chest Aorta W/cm &/or Wo/cm  02/05/2015  CLINICAL DATA:  Acute onset of left-sided abdominal pain, radiating to the left chest and left shoulder. Diarrhea. Nausea. Initial encounter. EXAM: CT ANGIOGRAPHY CHEST, ABDOMEN AND PELVIS TECHNIQUE: Multidetector CT imaging through the chest, abdomen and pelvis was performed using the standard protocol during bolus administration of intravenous contrast. Multiplanar reconstructed images and MIPs were obtained and reviewed to evaluate the vascular anatomy. CONTRAST:  100 mL of Omnipaque 350 IV contrast COMPARISON:  Chest radiograph performed earlier today at 12:31 a.m. FINDINGS: CTA CHEST FINDINGS There is no evidence of aortic dissection. There is no evidence of aneurysmal dilatation. No calcific atherosclerotic disease is seen. The great vessels are  unremarkable in appearance. There is no evidence of pulmonary embolus. There is somewhat unusual appearing focal airspace opacity at the left lower lobe, without evidence of associated pulmonary embolus. Given underlying hazy opacity within both lower lobes and a small left pleural effusion, findings are thought to reflect mild pulmonary edema. There is no evidence of pneumothorax. No masses are identified; no abnormal focal contrast enhancement is seen. A borderline prominent precarinal node is noted, measuring 1.2 cm in short axis. No additional mediastinal lymphadenopathy is seen. No pericardial effusion is identified. No axillary lymphadenopathy is seen. The thyroid gland is unremarkable in appearance. No acute osseous abnormalities are seen. Review of the MIP images confirms the above findings. CTA ABDOMEN AND PELVIS FINDINGS There is no evidence of aortic dissection. There is no evidence of aneurysmal dilatation. Mild calcification is noted along the abdominal aorta and its branches. No luminal narrowing is seen. The celiac trunk, superior mesenteric artery, bilateral renal arteries and inferior mesenteric artery appear fully patent. The inferior vena cava is grossly unremarkable in appearance. The liver and spleen are unremarkable in appearance. The gallbladder is within normal limits. The pancreas and adrenal glands are unremarkable. A 4.4 cm cyst is noted at the upper pole of the right kidney. The kidneys are otherwise unremarkable. There is no evidence of hydronephrosis. No renal or ureteral stones are seen. No perinephric stranding is appreciated. No free fluid is identified. The small bowel is unremarkable in appearance. The stomach is within normal limits. No acute vascular abnormalities are seen. The appendix is normal in caliber, without evidence of appendicitis. The colon is unremarkable in appearance. The bladder is mildly distended and grossly unremarkable. The prostate remains normal in size. No  inguinal lymphadenopathy is seen. No acute osseous abnormalities are identified. Review of the MIP images confirms the above findings. IMPRESSION: 1. No evidence of aortic dissection. No evidence of aneurysmal dilatation. Mild calcification along the abdominal aorta and its branches, without evidence of luminal narrowing. 2. No evidence of pulmonary embolus at this time. 3. Somewhat unusual focal airspace opacity at the left lower lobe, without evidence of associated pulmonary embolus. Given underlying hazy opacity within both lower lobes and a small left pleural effusion, findings are thought to reflect mild pulmonary edema. Alternately, this could reflect a resolved small pulmonary embolus with small pulmonary infarct, and subsequent pulmonary edema. 4. Borderline prominent precarinal node, measuring 1.2 cm in short axis, nonspecific in appearance. 5.  Right renal cyst noted. Electronically Signed   By: Roanna Raider M.D.   On: 02/05/2015 02:37   Ct Angio Abd/pel W/ And/or W/o  02/05/2015  CLINICAL DATA:  Acute onset of left-sided abdominal pain, radiating to the left chest and left shoulder. Diarrhea. Nausea. Initial encounter. EXAM: CT ANGIOGRAPHY CHEST, ABDOMEN AND PELVIS TECHNIQUE: Multidetector CT imaging through the chest, abdomen and pelvis was performed using the standard protocol during bolus administration of intravenous contrast. Multiplanar reconstructed images and MIPs were obtained and reviewed to evaluate the vascular anatomy. CONTRAST:  100 mL of Omnipaque 350 IV contrast COMPARISON:  Chest radiograph performed earlier today at 12:31 a.m. FINDINGS: CTA CHEST FINDINGS There is no evidence of aortic dissection. There is no evidence of aneurysmal dilatation. No calcific atherosclerotic disease is seen. The great vessels are unremarkable in appearance. There is no evidence of pulmonary embolus. There is somewhat unusual appearing focal airspace opacity at the left lower lobe, without evidence of  associated pulmonary embolus. Given underlying hazy opacity within both lower lobes and a small left pleural effusion, findings are thought to reflect mild pulmonary edema. There is no evidence of pneumothorax. No masses are identified; no abnormal focal contrast enhancement is seen. A borderline prominent precarinal node is noted, measuring 1.2 cm in short axis. No additional mediastinal lymphadenopathy is seen. No pericardial effusion is identified. No axillary lymphadenopathy is seen. The thyroid gland is unremarkable in appearance. No acute osseous abnormalities are seen. Review of the MIP images confirms the above findings. CTA ABDOMEN AND PELVIS FINDINGS There is no evidence of aortic dissection. There is no evidence of aneurysmal dilatation. Mild calcification is noted along the abdominal aorta and its branches. No luminal narrowing is seen. The celiac trunk, superior mesenteric artery, bilateral renal arteries and inferior mesenteric artery appear fully patent. The inferior vena cava is grossly unremarkable in appearance. The liver and spleen are unremarkable in appearance. The gallbladder is within normal limits. The pancreas and adrenal glands are unremarkable. A 4.4 cm cyst is noted at the upper pole of the right kidney. The kidneys are otherwise unremarkable. There is no evidence of hydronephrosis. No renal or ureteral stones are seen. No perinephric stranding is appreciated. No free fluid is identified. The small bowel is unremarkable in appearance. The stomach is within normal limits. No acute vascular abnormalities are seen. The appendix is normal in caliber, without evidence of appendicitis. The colon is unremarkable in appearance. The bladder is mildly distended and grossly unremarkable. The prostate remains normal in size. No inguinal lymphadenopathy is seen. No acute osseous abnormalities are identified. Review of the MIP images confirms the above findings. IMPRESSION: 1. No evidence of aortic  dissection. No evidence of aneurysmal dilatation. Mild calcification along the abdominal aorta and its branches, without evidence of luminal narrowing. 2. No evidence of pulmonary embolus at this time. 3. Somewhat unusual focal airspace opacity at the left lower lobe, without evidence of associated pulmonary embolus. Given underlying hazy opacity within both lower lobes and a small left pleural effusion, findings are thought to reflect mild pulmonary edema. Alternately, this could reflect a resolved small pulmonary embolus with small pulmonary infarct, and subsequent pulmonary edema. 4. Borderline prominent precarinal node, measuring 1.2 cm in short axis, nonspecific in appearance. 5. Right renal cyst noted. Electronically Signed   By: Roanna Raider M.D.   On: 02/05/2015 02:37    Microbiology: Recent Results (from the past 240 hour(s))  MRSA PCR Screening     Status: None  Collection Time: 02/05/15  6:43 AM  Result Value Ref Range Status   MRSA by PCR NEGATIVE NEGATIVE Final    Comment:        The GeneXpert MRSA Assay (FDA approved for NASAL specimens only), is one component of a comprehensive MRSA colonization surveillance program. It is not intended to diagnose MRSA infection nor to guide or monitor treatment for MRSA infections.   Culture, blood (routine x 2) Call MD if unable to obtain prior to antibiotics being given     Status: None (Preliminary result)   Collection Time: 02/05/15  6:45 AM  Result Value Ref Range Status   Specimen Description BLOOD BLOOD LEFT HAND  Final   Special Requests BOTTLES DRAWN AEROBIC AND ANAEROBIC 10CC  Final   Culture NO GROWTH 2 DAYS  Final   Report Status PENDING  Incomplete  Culture, blood (routine x 2) Call MD if unable to obtain prior to antibiotics being given     Status: None (Preliminary result)   Collection Time: 02/05/15  6:50 AM  Result Value Ref Range Status   Specimen Description BLOOD BLOOD LEFT HAND  Final   Special Requests BOTTLES  DRAWN AEROBIC AND ANAEROBIC 10CC  Final   Culture NO GROWTH 2 DAYS  Final   Report Status PENDING  Incomplete     Labs: Basic Metabolic Panel:  Recent Labs Lab 02/05/15 0037 02/06/15 0348  NA 135 133*  K 3.5 3.9  CL 101 103  CO2 23 21*  GLUCOSE 98 119*  BUN 8 9  CREATININE 0.73 0.77  CALCIUM 9.3 8.6*   Liver Function Tests:  Recent Labs Lab 02/05/15 0109  AST 57*  ALT 54  ALKPHOS 93  BILITOT 0.3  PROT 8.2*  ALBUMIN 4.0    Recent Labs Lab 02/05/15 0109  LIPASE 31   No results for input(s): AMMONIA in the last 168 hours. CBC:  Recent Labs Lab 02/05/15 0037 02/06/15 0348 02/07/15 0518  WBC 12.6* 17.4* 15.0*  HGB 12.6* 11.0* 11.0*  HCT 37.2* 32.7* 32.7*  MCV 95.9 96.5 96.7  PLT 277 251 252   Cardiac Enzymes:  Recent Labs Lab 02/05/15 0645  TROPONINI <0.03   BNP: BNP (last 3 results) No results for input(s): BNP in the last 8760 hours.  ProBNP (last 3 results) No results for input(s): PROBNP in the last 8760 hours.  CBG: No results for input(s): GLUCAP in the last 168 hours.      Signed:  Marcellus Scott, MD, FACP, FHM. [name] Pager [phone]  If 7PM-7AM, please contact night-coverage [email or web address] Password [email or web address] 02/08/2015, 11:53 AM

## 2015-02-10 ENCOUNTER — Ambulatory Visit (INDEPENDENT_AMBULATORY_CARE_PROVIDER_SITE_OTHER): Payer: BLUE CROSS/BLUE SHIELD | Admitting: Internal Medicine

## 2015-02-10 ENCOUNTER — Encounter: Payer: Self-pay | Admitting: Internal Medicine

## 2015-02-10 VITALS — BP 128/68 | HR 77 | Temp 97.7°F | Resp 28 | Wt 160.0 lb

## 2015-02-10 DIAGNOSIS — J189 Pneumonia, unspecified organism: Secondary | ICD-10-CM | POA: Diagnosis not present

## 2015-02-10 LAB — CULTURE, BLOOD (ROUTINE X 2)
CULTURE: NO GROWTH
CULTURE: NO GROWTH

## 2015-02-10 MED ORDER — LEVOFLOXACIN 500 MG PO TABS: 500.0000 mg | ORAL_TABLET | Freq: Every day | ORAL | Status: DC

## 2015-02-10 NOTE — Progress Notes (Signed)
Subjective:    Patient ID: Roy SierrasMark G Patel, male    DOB: 12/23/1960, 54 y.o.   MRN: [id number]  HPI Here for hospital follow up--wife here Had pneumonia clinically-- had fever, and pleuritic chest pain Reviewed CT results-- atypical appearance  Did okay briefly in hospital before discharge Home 2 days Still sick--- spiking fever to 101 or so Pleuritic pain is better but still there Cough--but only slight sputum (white and yellow) DOE with minimal exertion  Feels he hasn't been felling right for about a month Rested up and improved--but then relapsed  Then had face to face visit with provider through wife's insurance Got tamiflu and magnesium for cramps Still not better so then in here  Started nicotine patch Still with rare cigarette--but is determined to stop  Current Outpatient Prescriptions on File Prior to Visit  Medication Sig Dispense Refill  . benzonatate (TESSALON) 200 MG capsule Take 200 mg by mouth 3 (three) times daily.     Marland Kitchen. dextromethorphan-guaiFENesin (TUSSIN DM) 10-100 MG/5ML liquid Take 10 mLs by mouth every 4 (four) hours as needed for cough.    . diclofenac (VOLTAREN) 50 MG EC tablet Take 1 tablet (50 mg total) by mouth 3 (three) times daily with meals as needed for mild pain or moderate pain. 15 tablet 0  . doxycycline (VIBRA-TABS) 100 MG tablet Take 1 tablet (100 mg total) by mouth 2 (two) times daily. 5 tablet 0  . losartan (COZAAR) 50 MG tablet Take 1 tablet (50 mg total) by mouth daily. 90 tablet 3  . nicotine (NICODERM CQ - DOSED IN MG/24 HOURS) 14 mg/24hr patch Place 1 patch (14 mg total) onto the skin daily. 28 patch 0   No current facility-administered medications on file prior to visit.    Allergies  Allergen Reactions  . Chlorthalidone Other (See Comments)    Felt bad/lethargic  . Sulfa Antibiotics Other (See Comments)    Jaundice    Past Medical History  Diagnosis Date  . Cluster headache   . G6PD deficiency (HCC)     ?  Marland Kitchen. Hypertension       Past Surgical History  Procedure Laterality Date  . Extensor tendon of forearm / wrist repair  ~1970    Left  . Meniscus repair  1997    [place], Right knee  . Meniscus repair  2000    [place], Left knee  . Anterior cruciate ligament repair  2009    MCL removed ([facility])    Family History  Problem Relation Age of Onset  . Cancer Mother     Lung  . Diabetes Father   . Heart disease Father     CHF  . Colon polyps Brother   . Hypertension Neg Hx   . Colon polyps Brother     Social History   Social History  . Marital Status: Single    Spouse Name: N/A  . Number of Children: 1  . Years of Education: N/A   Occupational History  . Golf Maintenance at [employer]    Social History Main Topics  . Smoking status: Current Every Day Smoker -- 1.00 packs/day    Types: Cigarettes  . Smokeless tobacco: Never Used  . Alcohol Use: 0.0 oz/week    0 Standard drinks or equivalent per week     Comment: occasional  . Drug Use: Not on file  . Sexual Activity: Not on file   Other Topics Concern  . Not on file   Social History  Narrative   Married:  2nd   Son in [place]   Step daughter here.   Review of Systems No appetite  No vomiting Some loose stools    Objective:   Physical Exam  Constitutional: He appears well-developed.  Appears mildly ill  Pulmonary/Chest: Effort normal. No respiratory distress.  Decreased breath sounds at left base ?some dullness bilaterally  Abdominal: Soft. There is no tenderness.          Assessment & Plan:

## 2015-02-10 NOTE — Progress Notes (Signed)
Pre visit review using our clinic review tool, if applicable. No additional management support is needed unless otherwise documented below in the visit note. 

## 2015-02-10 NOTE — Assessment & Plan Note (Addendum)
LLL pneumonia--seems to be worse since going home Only on doxy now Will change to levaquin He will be out of work--going to family in [place] to recuperate Will see him when he gets back before clearing him for work He must stop smoking

## 2015-02-15 ENCOUNTER — Encounter: Payer: Self-pay | Admitting: Internal Medicine

## 2015-02-20 ENCOUNTER — Encounter: Payer: Self-pay | Admitting: Internal Medicine

## 2015-02-21 ENCOUNTER — Encounter: Payer: Self-pay | Admitting: Internal Medicine

## 2015-02-21 ENCOUNTER — Ambulatory Visit (INDEPENDENT_AMBULATORY_CARE_PROVIDER_SITE_OTHER): Payer: BLUE CROSS/BLUE SHIELD | Admitting: Internal Medicine

## 2015-02-21 VITALS — BP 122/70 | HR 81 | Temp 97.7°F | Resp 16 | Wt 159.0 lb

## 2015-02-21 DIAGNOSIS — J189 Pneumonia, unspecified organism: Secondary | ICD-10-CM | POA: Diagnosis not present

## 2015-02-21 NOTE — Progress Notes (Signed)
Subjective:    Patient ID: Alinda SierrasMark G Vanyo, male    DOB: 10/12/1960, 54 y.o.   MRN: [id number]  HPI Here for follow up of pneumonia Feels better Still some cough Still some upper left pleuritic chest pain (and some towards diaphragm at times) Arms and legs still achy at times--though improved  Low grade fever-- up to 100.6 Ibuprofen some help--did have sweats with fever resolution Slight SOB--- gets fatigued with any extending activity (like walking)  Current Outpatient Prescriptions on File Prior to Visit  Medication Sig Dispense Refill  . benzonatate (TESSALON) 200 MG capsule Take 200 mg by mouth 3 (three) times daily.     Marland Kitchen. dextromethorphan-guaiFENesin (TUSSIN DM) 10-100 MG/5ML liquid Take 10 mLs by mouth every 4 (four) hours as needed for cough.    . diclofenac (VOLTAREN) 50 MG EC tablet Take 1 tablet (50 mg total) by mouth 3 (three) times daily with meals as needed for mild pain or moderate pain. 15 tablet 0  . losartan (COZAAR) 50 MG tablet Take 1 tablet (50 mg total) by mouth daily. 90 tablet 3  . nicotine (NICODERM CQ - DOSED IN MG/24 HOURS) 14 mg/24hr patch Place 1 patch (14 mg total) onto the skin daily. 28 patch 0   No current facility-administered medications on file prior to visit.    Allergies  Allergen Reactions  . Chlorthalidone Other (See Comments)    Felt bad/lethargic  . Sulfa Antibiotics Other (See Comments)    Jaundice    Past Medical History  Diagnosis Date  . Cluster headache   . G6PD deficiency (HCC)     ?  Marland Kitchen. Hypertension     Past Surgical History  Procedure Laterality Date  . Extensor tendon of forearm / wrist repair  ~1970    Left  . Meniscus repair  1997    [place], Right knee  . Meniscus repair  2000    [place], Left knee  . Anterior cruciate ligament repair  2009    MCL removed ([facility])    Family History  Problem Relation Age of Onset  . Cancer Mother     Lung  . Diabetes Father   . Heart disease Father     CHF  . Colon  polyps Brother   . Hypertension Neg Hx   . Colon polyps Brother     Social History   Social History  . Marital Status: Single    Spouse Name: N/A  . Number of Children: 1  . Years of Education: N/A   Occupational History  . 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 Smoking status: Current Every Day Smoker -- 1.00 packs/day    Types: Cigarettes  . Smokeless tobacco: Never Used  . Alcohol Use: 0.0 oz/week    0 Standard drinks or equivalent per week     Comment: occasional  . Drug Use: Not on file  . Sexual Activity: Not on file   Other Topics Concern  . Not on file   Social History Narrative   Married:  2nd   Son in [place]   Step daughter here.   Review of Systems  Appetite is improving Still smoking a little-- 1/3rd PPD and cutting down from there. Using patch     Objective:   Physical Exam  Constitutional: He appears well-developed and well-nourished. No distress.  Pulmonary/Chest: Effort normal and breath sounds normal. No respiratory distress. He has no wheezes. He has no rales.  Assessment & Plan:

## 2015-02-21 NOTE — Progress Notes (Signed)
Pre visit review using our clinic review tool, if applicable. No additional management support is needed unless otherwise documented below in the visit note. 

## 2015-02-21 NOTE — Assessment & Plan Note (Signed)
Slow improvement Finished with the levaquin If worsens, will restart the levaquin Exam normal today Okay to start back to work 1/2 time now Repeat CXR in about a month

## 2015-02-22 ENCOUNTER — Ambulatory Visit: Payer: BLUE CROSS/BLUE SHIELD | Admitting: Internal Medicine

## 2015-03-22 ENCOUNTER — Ambulatory Visit (INDEPENDENT_AMBULATORY_CARE_PROVIDER_SITE_OTHER)
Admission: RE | Admit: 2015-03-22 | Discharge: 2015-03-22 | Disposition: A | Discharge status: Home / Self Care | Payer: BLUE CROSS/BLUE SHIELD | Source: Ambulatory Visit | Attending: Internal Medicine | Admitting: Internal Medicine

## 2015-03-22 DIAGNOSIS — J189 Pneumonia, unspecified organism: Secondary | ICD-10-CM

## 2015-04-20 ENCOUNTER — Ambulatory Visit (INDEPENDENT_AMBULATORY_CARE_PROVIDER_SITE_OTHER)
Admission: RE | Admit: 2015-04-20 | Discharge: 2015-04-20 | Disposition: A | Discharge status: Home / Self Care | Payer: BLUE CROSS/BLUE SHIELD | Source: Ambulatory Visit | Attending: Internal Medicine | Admitting: Internal Medicine

## 2015-04-20 ENCOUNTER — Encounter: Payer: Self-pay | Admitting: Internal Medicine

## 2015-04-20 ENCOUNTER — Ambulatory Visit (INDEPENDENT_AMBULATORY_CARE_PROVIDER_SITE_OTHER): Payer: BLUE CROSS/BLUE SHIELD | Admitting: Internal Medicine

## 2015-04-20 VITALS — BP 140/90 | HR 85 | Temp 98.1°F | Wt 162.0 lb

## 2015-04-20 DIAGNOSIS — R091 Pleurisy: Secondary | ICD-10-CM | POA: Diagnosis not present

## 2015-04-20 DIAGNOSIS — Z720 Tobacco use: Secondary | ICD-10-CM | POA: Diagnosis not present

## 2015-04-20 DIAGNOSIS — R062 Wheezing: Secondary | ICD-10-CM

## 2015-04-20 DIAGNOSIS — R0789 Other chest pain: Secondary | ICD-10-CM

## 2015-04-20 DIAGNOSIS — F17200 Nicotine dependence, unspecified, uncomplicated: Secondary | ICD-10-CM

## 2015-04-20 MED ORDER — VARENICLINE TARTRATE 0.5 MG X 11 & 1 MG X 42 PO MISC: ORAL | Status: DC

## 2015-04-20 MED ORDER — PREDNISONE 10 MG PO TABS: ORAL_TABLET | ORAL | Status: DC

## 2015-04-20 NOTE — Progress Notes (Signed)
Pre visit review using our clinic review tool, if applicable. No additional management support is needed unless otherwise documented below in the visit note. 

## 2015-04-20 NOTE — Progress Notes (Signed)
HPI  Pt presents to the clinic today with c/o nasal congestion, cough and chest congestion. This started 4 days ago. He is blowing clear mucous out of his nose. The cough is non productive. He denies shortness of breath but does have pain in his left upper chest. He feels like the pain is musculoskeletal and not his heart. He has had low grade fevers but denies chills or body aches. He has tried Robitussin DM with minimal relief. He was hospitalized 01/2015 for a CAP. He had a chest xray 12/4 which showed clearing of the pneumonia. He reports that pain in his left upper chest never completely cleared up.  Review of Systems      Past Medical History  Diagnosis Date  . Cluster headache   . G6PD deficiency (HCC)     ?  Marland Kitchen Hypertension     Family History  Problem Relation Age of Onset  . Cancer Mother     Lung  . Diabetes Father   . Heart disease Father     CHF  . Colon polyps Brother   . Hypertension Neg Hx   . Colon polyps Brother     Social History   Social History  . Marital Status: Single    Spouse Name: N/A  . Number of Children: 1  . 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 Smoking status: Current Every Day Smoker -- 1.00 packs/day    Types: Cigarettes  . Smokeless tobacco: Never Used  . Alcohol Use: 0.0 oz/week    0 Standard drinks or equivalent per week     Comment: occasional  . Drug Use: Not on file  . Sexual Activity: Not on file   Other Topics Concern  . Not on file   Social History Narrative   Married:  2nd   Son in [place]   Step daughter here.    Allergies  Allergen Reactions  . Chlorthalidone Other (See Comments)    Felt bad/lethargic  . Sulfa Antibiotics Other (See Comments)    Jaundice     Constitutional: Positive fever. Denies headache, fatigue, or abrupt weight changes.  HEENT:  Positive nasal congestion. Denies eye redness, eye pain, pressure behind the eyes,  facial pain, ear pain, ringing in the ears, wax buildup, runny nose or sore throat. Respiratory: Positive cough. Denies difficulty breathing or shortness of breath.  Cardiovascular: Denies chest pain, chest tightness, palpitations or swelling in the hands or feet.   No other specific complaints in a complete review of systems (except as listed in HPI above).  Objective:   BP 140/90 mmHg  Pulse 85  Temp(Src) 98.1 F (36.7 C) (Oral)  Wt 162 lb (73.483 kg)  SpO2 96% Wt Readings from Last 3 Encounters:  04/20/15 162 lb (73.483 kg)  02/21/15 159 lb (72.122 kg)  02/10/15 160 lb (72.576 kg)     General: Appears his stated age,  in NAD. HEENT: Head: normal shape and size, no sinus tenderness noted; Eyes: sclera white, no icterus, conjunctiva pink; Ears: Tm's gray and intact, normal light reflex; Nose: mucosa boggy and moist, septum midline; Throat/Mouth: + PND. Teeth present, mucosa pink and moist, no exudate noted, no lesions or ulcerations noted.  Neck: No cervical lymphadenopathy.  Cardiovascular: Normal rate and rhythm. S1,S2 noted.  No murmur, rubs or gallops noted.  Pulmonary/Chest: Normal effort and positive vesicular breath sounds, inspiratory wheezing noted in the LUL,  LLL. No respiratory distress. No rales or ronchi noted.  MSK: Mild tenderness noted with palpation of the left chest wall.    Assessment & Plan:   Chest wall pain, wheezing and pleurisy:  Get some rest and drink plenty of water Will check chest xray today to make sure no reoccurrence of CAP eRx for Pred Taper for possible costochondritis and wheezing He is interested in quitting smoking, he tells me the patches are not effective. He is interested in trying Chantix, RX given (potential quit date 05/18/15)  RTC as needed or if symptoms persist.

## 2015-04-21 ENCOUNTER — Telehealth: Payer: Self-pay | Admitting: Internal Medicine

## 2015-04-21 ENCOUNTER — Other Ambulatory Visit: Payer: Self-pay | Admitting: Internal Medicine

## 2015-04-21 MED ORDER — DOXYCYCLINE HYCLATE 100 MG PO TABS: 100.0000 mg | ORAL_TABLET | Freq: Two times a day (BID) | ORAL | Status: DC

## 2015-04-21 NOTE — Telephone Encounter (Signed)
Xray was normal. Will send abx to pharmacy for possible bronchitis given his recent pneumonia

## 2015-04-21 NOTE — Telephone Encounter (Signed)
Roy Patel left v/m requesting cb about abx.

## 2015-04-21 NOTE — Patient Instructions (Signed)

## 2015-04-21 NOTE — Telephone Encounter (Signed)
Left message on voicemail.

## 2015-04-21 NOTE — Telephone Encounter (Signed)
Patient Name: Roy Patel  DOB: 02/06/1961    Initial Comment Caller states husband was seen yesterday for chest congestion, had x-ray. Now has green mucus with cough.   Nurse Assessment  Nurse: Sherilyn CooterHenry, RN, Thurmond ButtsWade Date/Time Lamount Cohen(Eastern Time): 04/21/2015 9:39:23 AM  Confirm and document reason for call. If symptomatic, describe symptoms. ---Caller states that her husband was seen yesterday for chest congestion. They did x-ray on his chest, but she does not know the results. He began producing green mucus last night. She is not with her husband at this time. He was prescribed a steroid yesterday. She wants to know if he needs antibiotics.  Has the patient traveled out of the country within the last 30 days? ---Not Applicable  Does the patient have any new or worsening symptoms? ---Yes  Will a triage be completed? ---No  Select reason for no triage. ---Other     Guidelines    Guideline Title Affirmed Question Affirmed Notes       Final Disposition User   Attempt made - message left Sherilyn CooterHenry, RN, Thurmond ButtsWade    Comments  Caller is not with her husband. She is wondering about the x-ray results and whether he needs antibiotics. I called the backline and did a warm transfer to [facility] for further assistance.

## 2015-09-19 ENCOUNTER — Encounter: Payer: BLUE CROSS/BLUE SHIELD | Admitting: Internal Medicine

## 2015-09-29 ENCOUNTER — Ambulatory Visit (INDEPENDENT_AMBULATORY_CARE_PROVIDER_SITE_OTHER): Payer: BLUE CROSS/BLUE SHIELD | Admitting: Internal Medicine

## 2015-09-29 ENCOUNTER — Encounter: Payer: Self-pay | Admitting: Internal Medicine

## 2015-09-29 ENCOUNTER — Ambulatory Visit (INDEPENDENT_AMBULATORY_CARE_PROVIDER_SITE_OTHER)
Admission: RE | Admit: 2015-09-29 | Discharge: 2015-09-29 | Disposition: A | Discharge status: Home / Self Care | Payer: BLUE CROSS/BLUE SHIELD | Source: Ambulatory Visit | Attending: Internal Medicine | Admitting: Internal Medicine

## 2015-09-29 VITALS — BP 128/84 | HR 79 | Temp 98.2°F | Ht 68.5 in | Wt 156.0 lb

## 2015-09-29 DIAGNOSIS — Z23 Encounter for immunization: Secondary | ICD-10-CM

## 2015-09-29 DIAGNOSIS — B1920 Unspecified viral hepatitis C without hepatic coma: Secondary | ICD-10-CM

## 2015-09-29 DIAGNOSIS — J189 Pneumonia, unspecified organism: Secondary | ICD-10-CM | POA: Diagnosis not present

## 2015-09-29 DIAGNOSIS — I10 Essential (primary) hypertension: Secondary | ICD-10-CM | POA: Diagnosis not present

## 2015-09-29 DIAGNOSIS — Z125 Encounter for screening for malignant neoplasm of prostate: Secondary | ICD-10-CM

## 2015-09-29 DIAGNOSIS — Z0000 Encounter for general adult medical examination without abnormal findings: Secondary | ICD-10-CM | POA: Diagnosis not present

## 2015-09-29 DIAGNOSIS — Z1211 Encounter for screening for malignant neoplasm of colon: Secondary | ICD-10-CM

## 2015-09-29 MED ORDER — FLUTICASONE PROPIONATE 50 MCG/ACT NA SUSP: 2.0000 | Freq: Every day | NASAL | Status: DC

## 2015-09-29 NOTE — Assessment & Plan Note (Signed)
Has had left chest pain since this Not sick--- will recheck CXR

## 2015-09-29 NOTE — Progress Notes (Signed)
Subjective:    Patient ID: Roy Patel, male    DOB: 05/25/1960, 55 y.o.   MRN: [id number]  HPI Here for physical--but several acute issues  Had a horrible allergy year-- started afrin regularly 1-2 per day Still with nasal congestion but not as bad now Thinks he has sinus infection due to this---blowing out green mucus for 2 weeks No fever No SOB  Hasn't quit smoking yet Occasionally has pain in chest---same as when he had pneumonia (never completely went away) Didn't try the chantix Patches help but not able to stop completely. Smokes 3-5 per day Discussed patches and then use lozenge No wheezing Still coughs daily--not that much. Worse if supine  Having right shoulder pain Starts under scapula and then to deltoid Feels muscular ---can move well, but hurts at times Normal ROM  Current Outpatient Prescriptions on File Prior to Visit  Medication Sig Dispense Refill  . losartan (COZAAR) 50 MG tablet Take 1 tablet (50 mg total) by mouth daily. 90 tablet 3  . nicotine (NICODERM CQ - DOSED IN MG/24 HOURS) 14 mg/24hr patch Place 1 patch (14 mg total) onto the skin daily. (Patient not taking: Reported on 09/29/2015) 28 patch 0   No current facility-administered medications on file prior to visit.    Allergies  Allergen Reactions  . Chlorthalidone Other (See Comments)    Felt bad/lethargic  . Sulfa Antibiotics Other (See Comments)    Jaundice    Past Medical History  Diagnosis Date  . Cluster headache   . G6PD deficiency (HCC)     ?  Marland Kitchen. Hypertension     Past Surgical History  Procedure Laterality Date  . Extensor tendon of forearm / wrist repair  ~1970    Left  . Meniscus repair  1997    [place], Right knee  . Meniscus repair  2000    [place], Left knee  . Anterior cruciate ligament repair  2009    MCL removed ([facility])    Family History  Problem Relation Age of Onset  . Cancer Mother     Lung  . Diabetes Father   . Heart disease Father     CHF  .  Colon polyps Brother   . Hypertension Neg Hx   . Colon polyps Brother     Social History   Social History  . Marital Status: Single    Spouse Name: N/A  . Number of Children: 1  . Years of Education: N/A   Occupational History  . Golf Maintenance [employer]    Social History Main Topics  . Smoking status: Current Every Day Smoker -- 1.00 packs/day    Types: Cigarettes  . Smokeless tobacco: Never Used  . Alcohol Use: 0.0 oz/week    0 Standard drinks or equivalent per week     Comment: occasional  . Drug Use: Not on file  . Sexual Activity: Not on file   Other Topics Concern  . Not on file   Social History Narrative   Married:  2nd   Son in [place]   Step daughter here.   Review of Systems  Constitutional: Negative for fatigue.       Weight not quite back to normal since pneumonia Wears seat belt  HENT: Negative for hearing loss and tinnitus.        Bad teeth-- not good about the dentist  Eyes: Negative for visual disturbance.       No diplopia or unilateral vision loss  Respiratory: Positive for cough. Negative for chest tightness and shortness of breath.   Cardiovascular: Positive for chest pain. Negative for palpitations and leg swelling.       Chest pain since pneumonia  Gastrointestinal: Negative for nausea, vomiting, abdominal pain, constipation and blood in stool.  Endocrine: Negative for polydipsia and polyuria.  Genitourinary: Negative for dysuria and urgency.       No sexual problems  Musculoskeletal: Positive for arthralgias. Negative for back pain and joint swelling.       Using regular ibuprofen for knees, hands, shoulder 8 per day usually  Skin: Negative for rash.       Dry skin No suspicious lesions  Allergic/Immunologic: Positive for environmental allergies. Negative for immunocompromised state.  Neurological: Positive for headaches. Negative for dizziness, syncope, weakness and light-headedness.       No recent cluster headaches    Hematological: Negative for adenopathy. Does not bruise/bleed easily.  Psychiatric/Behavioral: Negative for dysphoric mood. The patient is not nervous/anxious.        Occasional sleep problems       Objective:   Physical Exam  Constitutional: He is oriented to person, place, and time. He appears well-developed and well-nourished. No distress.  HENT:  Mouth/Throat: Oropharynx is clear and moist. No oropharyngeal exudate.  No sinus tenderness Severe pale congestion right nare/milder on left  Eyes: Conjunctivae are normal. Pupils are equal, round, and reactive to light.  Neck: Normal range of motion. Neck supple. No thyromegaly present.  Cardiovascular: Normal rate, regular rhythm, normal heart sounds and intact distal pulses.  Exam reveals no gallop.   No murmur heard. Pulmonary/Chest: Effort normal and breath sounds normal. No respiratory distress. He has no wheezes. He has no rales.  Abdominal: Soft. There is no tenderness.  Musculoskeletal: He exhibits no edema or tenderness.  Normal ROM in shoulders No internal derangement  Lymphadenopathy:    He has no cervical adenopathy.  Neurological: He is alert and oriented to person, place, and time.  Skin: No rash noted. No erythema.  Psychiatric: He has a normal mood and affect. His behavior is normal.          Assessment & Plan:

## 2015-09-29 NOTE — Assessment & Plan Note (Signed)
Tdap today Will check PSA after discussion FIT

## 2015-09-29 NOTE — Addendum Note (Signed)
Addended by: Eual FinesBRIDGES, Jewett Mcgann P on: 09/29/2015 04:56 PM   Modules accepted: Orders

## 2015-09-29 NOTE — Assessment & Plan Note (Signed)
BP Readings from Last 3 Encounters:  09/29/15 128/84  04/20/15 140/90  02/21/15 122/70   Good control No change

## 2015-09-29 NOTE — Patient Instructions (Signed)
Try flonase daily for the congestion. If you feel sick, send me message and I will prescribe an antibiotic. Use the nicotine patch daily and a nicotine lozenge anytime you have an urge---DO NOT SMOKE!!

## 2015-09-29 NOTE — Assessment & Plan Note (Signed)
Now there is treatment Will refer if still has sig titer

## 2015-09-29 NOTE — Progress Notes (Signed)
Pre visit review using our clinic review tool, if applicable. No additional management support is needed unless otherwise documented below in the visit note. 

## 2015-09-30 ENCOUNTER — Encounter: Payer: Self-pay | Admitting: Internal Medicine

## 2015-09-30 LAB — HEPATITIS C RNA QUANTITATIVE
HCV QUANT LOG: 4.47 {Log} — AB (ref <1.18)
HCV QUANT: 29588 [IU]/mL — AB (ref <15)

## 2015-09-30 LAB — PSA: PSA: 0.17 ng/mL (ref 0.10–4.00)

## 2015-09-30 LAB — CBC WITH DIFFERENTIAL/PLATELET
BASOS ABS: 0.1 10*3/uL (ref 0.0–0.1)
Basophils Relative: 0.6 % (ref 0.0–3.0)
EOS ABS: 0.6 10*3/uL (ref 0.0–0.7)
Eosinophils Relative: 7.7 % — ABNORMAL HIGH (ref 0.0–5.0)
HEMATOCRIT: 41.4 % (ref 39.0–52.0)
HEMOGLOBIN: 13.9 g/dL (ref 13.0–17.0)
LYMPHS PCT: 39.8 % (ref 12.0–46.0)
Lymphs Abs: 3.2 10*3/uL (ref 0.7–4.0)
MCHC: 33.6 g/dL (ref 30.0–36.0)
MCV: 97 fl (ref 78.0–100.0)
Monocytes Absolute: 0.5 10*3/uL (ref 0.1–1.0)
Monocytes Relative: 6.5 % (ref 3.0–12.0)
Neutro Abs: 3.7 10*3/uL (ref 1.4–7.7)
Neutrophils Relative %: 45.4 % (ref 43.0–77.0)
Platelets: 281 10*3/uL (ref 150.0–400.0)
RBC: 4.26 Mil/uL (ref 4.22–5.81)
RDW: 13.4 % (ref 11.5–15.5)
WBC: 8.1 10*3/uL (ref 4.0–10.5)

## 2015-09-30 LAB — COMPREHENSIVE METABOLIC PANEL
ALBUMIN: 4.7 g/dL (ref 3.5–5.2)
ALK PHOS: 81 U/L (ref 39–117)
ALT: 31 U/L (ref 0–53)
AST: 34 U/L (ref 0–37)
BILIRUBIN TOTAL: 0.3 mg/dL (ref 0.2–1.2)
BUN: 22 mg/dL (ref 6–23)
CALCIUM: 10.3 mg/dL (ref 8.4–10.5)
CHLORIDE: 107 meq/L (ref 96–112)
CO2: 28 mEq/L (ref 19–32)
CREATININE: 1.5 mg/dL (ref 0.40–1.50)
GFR: 51.59 mL/min — ABNORMAL LOW (ref >60.00)
Glucose, Bld: 94 mg/dL (ref 70–99)
Potassium: 4.4 mEq/L (ref 3.5–5.1)
Sodium: 142 mEq/L (ref 135–145)
Total Protein: 8.3 g/dL (ref 6.0–8.3)

## 2015-09-30 LAB — LIPID PANEL
CHOL/HDL RATIO: 3
CHOLESTEROL: 144 mg/dL (ref 0–200)
HDL: 52.9 mg/dL (ref >39.00)
NonHDL: 91.28
TRIGLYCERIDES: 223 mg/dL — AB (ref 0.0–149.0)
VLDL: 44.6 mg/dL — AB (ref 0.0–40.0)

## 2015-09-30 LAB — LDL CHOLESTEROL, DIRECT: Direct LDL: 55 mg/dL

## 2015-09-30 MED ORDER — LOSARTAN POTASSIUM 50 MG PO TABS: 50.0000 mg | ORAL_TABLET | Freq: Every day | ORAL | Status: DC

## 2015-09-30 NOTE — Telephone Encounter (Signed)
Rx sent electronically.  

## 2015-10-17 ENCOUNTER — Telehealth: Payer: Self-pay

## 2015-10-17 NOTE — Telephone Encounter (Signed)
-----   Message from Karie Schwalbeichard I Letvak, MD sent at 10/13/2015  1:53 PM EDT ----- Please call him again If he is not sure about referral, we should at least do the fibrosure test (just a blood test)

## 2015-10-17 NOTE — Telephone Encounter (Signed)
Left message on cell asking to call back.

## 2015-11-30 ENCOUNTER — Ambulatory Visit: Payer: BLUE CROSS/BLUE SHIELD | Admitting: Family Medicine

## 2015-12-01 ENCOUNTER — Ambulatory Visit (INDEPENDENT_AMBULATORY_CARE_PROVIDER_SITE_OTHER): Payer: BLUE CROSS/BLUE SHIELD | Admitting: Family Medicine

## 2015-12-01 ENCOUNTER — Encounter: Payer: Self-pay | Admitting: Family Medicine

## 2015-12-01 DIAGNOSIS — R05 Cough: Secondary | ICD-10-CM | POA: Diagnosis not present

## 2015-12-01 DIAGNOSIS — R059 Cough, unspecified: Secondary | ICD-10-CM | POA: Insufficient documentation

## 2015-12-01 NOTE — Progress Notes (Signed)
Pre visit review using our clinic review tool, if applicable. No additional management support is needed unless otherwise documented below in the visit note. 

## 2015-12-01 NOTE — Patient Instructions (Signed)
Out of work today.  Rest and fluids.  Start using nasal saline and then flonase.  That should help with the nasal congestion and runny nose.  Update us as needed.  Take care.  Glad to see you.

## 2015-12-01 NOTE — Assessment & Plan Note (Signed)
He feels some better.  Ctab.  Nontoxic.  Likely viral.  Supportive care.  Update us as needed.  Low threshold for tx if worsening but likely with potential risk>benefit of abx at this point.  D/w pt.  He agrees.  For chronic intermittent sinus sx- had been off flonase.  Restart, use nasal saline.  He agrees.  Sinuses not ttp Routed to PCP per patient request.

## 2015-12-01 NOTE — Progress Notes (Signed)
Smoker, wearing a patch and trying to quit smoking.  H/o PNA noted.    Sx started about 2 days ago.  First noted coughing a few day earlier in the week.  Temp ~100, not higher. No sputum.  Tired, chest feels tighter than normal.   No aches.  He feels some better today.  Voice is still a little raspy.  No new rhinorrhea, no ear pain.  He has intermittent green rhinorrhea at baseline, even when not sick.    Sick contact at home.    Meds, vitals, and allergies reviewed.   ROS: Per HPI unless specifically indicated in ROS section   GEN: nad, alert and oriented HEENT: mucous membranes moist, tm w/o erythema, nasal exam w/o erythema, clear discharge noted,  OP with cobblestoning, sinuses not ttp NECK: supple w/o LA CV: rrr.   PULM: ctab, no inc wob EXT: no edema SKIN: no acute rash

## 2016-05-02 ENCOUNTER — Ambulatory Visit: Payer: BLUE CROSS/BLUE SHIELD | Admitting: Internal Medicine

## 2016-05-09 ENCOUNTER — Ambulatory Visit (INDEPENDENT_AMBULATORY_CARE_PROVIDER_SITE_OTHER): Payer: 59 | Admitting: Internal Medicine

## 2016-05-09 ENCOUNTER — Encounter: Payer: Self-pay | Admitting: Internal Medicine

## 2016-05-09 VITALS — BP 138/78 | HR 76 | Temp 98.1°F | Wt 163.0 lb

## 2016-05-09 DIAGNOSIS — G8929 Other chronic pain: Secondary | ICD-10-CM

## 2016-05-09 DIAGNOSIS — Z23 Encounter for immunization: Secondary | ICD-10-CM

## 2016-05-09 DIAGNOSIS — M25511 Pain in right shoulder: Secondary | ICD-10-CM | POA: Diagnosis not present

## 2016-05-09 MED ORDER — CYCLOBENZAPRINE HCL 10 MG PO TABS: 10.0000 mg | ORAL_TABLET | Freq: Every evening | ORAL | 0 refills | Status: DC | PRN

## 2016-05-09 NOTE — Progress Notes (Signed)
   Subjective:    Patient ID: Roy SierrasMark G Patel, male    DOB: 06/24/1960, 56 y.o.   MRN: [id number]  HPI Here due to shoulder pain  This is really bad Keeps him up at night Pain moves--sometimes in neck, sometimes in shoulder, can go down arm Has trouble moving his arm---hears grinding with movement  Left shoulder also hurts--but not as bad  Aleve 1 bid--- not really helping  Current Outpatient Prescriptions on File Prior to Visit  Medication Sig Dispense Refill  . fluticasone (FLONASE) 50 MCG/ACT nasal spray Place 2 sprays into both nostrils daily. In each nostril 16 g 12  . losartan (COZAAR) 50 MG tablet Take 1 tablet (50 mg total) by mouth daily. 90 tablet 3   No current facility-administered medications on file prior to visit.     Allergies  Allergen Reactions  . Chlorthalidone Other (See Comments)    Felt bad/lethargic  . Sulfa Antibiotics Other (See Comments)    Jaundice    Past Medical History:  Diagnosis Date  . Cluster headache   . G6PD deficiency (HCC)    ?  Marland Kitchen. Hypertension     Past Surgical History:  Procedure Laterality Date  . ANTERIOR CRUCIATE LIGAMENT REPAIR  2009   MCL removed ([facility])  . EXTENSOR TENDON OF FOREARM / WRIST REPAIR  ~1970   Left  . MENISCUS REPAIR  1997   [place], Right knee  . MENISCUS REPAIR  2000   [place], Left knee    Family History  Problem Relation Age of Onset  . Cancer Mother     Lung  . Diabetes Father   . Heart disease Father     CHF  . Colon polyps Brother   . Colon polyps Brother   . Hypertension Neg Hx     Social History   Social History  . Marital status: Single    Spouse name: N/A  . Number of children: 1  . Years of education: N/A   Occupational History  . Golf Maintenance [employer]    Social History Main Topics  . Smoking status: Current Every Day Smoker    Packs/day: 1.00    Types: Cigarettes  . Smokeless tobacco: Never Used  . Alcohol use 0.0 oz/week     Comment: occasional  . Drug  use: Unknown  . Sexual activity: Not on file   Other Topics Concern  . Not on file   Social History Narrative   Married:  2nd   Son in [place]   Step daughter here.   Review of Systems  Never got appt with the hepatitis clinic Sleeps is bad due to shoulder pain Pain with neck extension     Objective:   Physical Exam  Neck:  Fair ROM but pain with full extension  Musculoskeletal:  No right shoulder swelling Fair passive ROM without clear crepitus Normal active abduction  Neurological:  Normal strength in arms/hands          Assessment & Plan:

## 2016-05-09 NOTE — Progress Notes (Signed)
Pre visit review using our clinic review tool, if applicable. No additional management support is needed unless otherwise documented below in the visit note. 

## 2016-05-09 NOTE — Addendum Note (Signed)
Addended by: Swayzee Wadley P on: 03/19/2017 10:09 AM   Modules accepted: Orders  

## 2016-05-09 NOTE — Assessment & Plan Note (Signed)
Not clear if this is intrinsic shoulder problems (symptoms on other side support this) or cervical radiculopathy Will try cyclobenzaprine to help sleep Will have him see Dr Dion SaucierLandau---- if doesn't seem to be shoulder, would proceed with cervical MRI

## 2016-05-14 DIAGNOSIS — M5412 Radiculopathy, cervical region: Secondary | ICD-10-CM | POA: Diagnosis not present

## 2016-05-14 DIAGNOSIS — M25511 Pain in right shoulder: Secondary | ICD-10-CM | POA: Diagnosis not present

## 2016-06-13 DIAGNOSIS — M5412 Radiculopathy, cervical region: Secondary | ICD-10-CM | POA: Diagnosis not present

## 2016-06-19 DIAGNOSIS — M542 Cervicalgia: Secondary | ICD-10-CM | POA: Diagnosis not present

## 2016-07-09 DIAGNOSIS — M542 Cervicalgia: Secondary | ICD-10-CM | POA: Diagnosis not present

## 2016-07-09 DIAGNOSIS — M5412 Radiculopathy, cervical region: Secondary | ICD-10-CM | POA: Diagnosis not present

## 2016-07-19 DIAGNOSIS — M542 Cervicalgia: Secondary | ICD-10-CM | POA: Diagnosis not present

## 2016-07-19 DIAGNOSIS — M5412 Radiculopathy, cervical region: Secondary | ICD-10-CM | POA: Diagnosis not present

## 2016-07-26 DIAGNOSIS — M542 Cervicalgia: Secondary | ICD-10-CM | POA: Diagnosis not present

## 2016-07-26 DIAGNOSIS — M5412 Radiculopathy, cervical region: Secondary | ICD-10-CM | POA: Diagnosis not present

## 2016-07-31 DIAGNOSIS — M542 Cervicalgia: Secondary | ICD-10-CM | POA: Diagnosis not present

## 2016-07-31 DIAGNOSIS — M5412 Radiculopathy, cervical region: Secondary | ICD-10-CM | POA: Diagnosis not present

## 2016-08-02 DIAGNOSIS — M5412 Radiculopathy, cervical region: Secondary | ICD-10-CM | POA: Diagnosis not present

## 2016-08-02 DIAGNOSIS — M542 Cervicalgia: Secondary | ICD-10-CM | POA: Diagnosis not present

## 2016-10-05 ENCOUNTER — Encounter: Payer: BLUE CROSS/BLUE SHIELD | Admitting: Internal Medicine

## 2016-12-03 ENCOUNTER — Ambulatory Visit (INDEPENDENT_AMBULATORY_CARE_PROVIDER_SITE_OTHER)
Admission: RE | Admit: 2016-12-03 | Discharge: 2016-12-03 | Disposition: A | Discharge status: Home / Self Care | Payer: 59 | Source: Ambulatory Visit | Attending: Family Medicine | Admitting: Family Medicine

## 2016-12-03 ENCOUNTER — Ambulatory Visit (INDEPENDENT_AMBULATORY_CARE_PROVIDER_SITE_OTHER): Payer: 59 | Admitting: Family Medicine

## 2016-12-03 ENCOUNTER — Encounter: Payer: Self-pay | Admitting: Family Medicine

## 2016-12-03 ENCOUNTER — Ambulatory Visit: Payer: 59 | Admitting: Family Medicine

## 2016-12-03 VITALS — BP 130/72 | HR 85 | Temp 98.6°F | Wt 159.8 lb

## 2016-12-03 DIAGNOSIS — R05 Cough: Secondary | ICD-10-CM

## 2016-12-03 DIAGNOSIS — J189 Pneumonia, unspecified organism: Secondary | ICD-10-CM | POA: Diagnosis not present

## 2016-12-03 DIAGNOSIS — R059 Cough, unspecified: Secondary | ICD-10-CM

## 2016-12-03 MED ORDER — ALBUTEROL SULFATE HFA 108 (90 BASE) MCG/ACT IN AERS: 1.0000 | INHALATION_SPRAY | Freq: Four times a day (QID) | RESPIRATORY_TRACT | 0 refills | Status: DC | PRN

## 2016-12-03 MED ORDER — AZITHROMYCIN 250 MG PO TABS: ORAL_TABLET | ORAL | 0 refills | Status: DC

## 2016-12-03 MED ORDER — PREDNISONE 20 MG PO TABS: ORAL_TABLET | ORAL | 0 refills | Status: DC

## 2016-12-03 MED ORDER — AMOXICILLIN 500 MG PO CAPS: 1000.0000 mg | ORAL_CAPSULE | Freq: Three times a day (TID) | ORAL | 0 refills | Status: DC

## 2016-12-03 NOTE — Patient Instructions (Addendum)
Amoxil 3 times a day.  zithromax once daily.  Albuterol as needed.  Go to the lab on the way out.  We'll contact you with your xray report. Update me in about 2 days, sooner if needed.  If more chest pain or short of breath then go to the ER.  Prednisone with food.  Take care.  Glad to see you.

## 2016-12-03 NOTE — Progress Notes (Signed)
He is cutting back on smoking.  H/o bronchitis, pleurisy, in the past.  Sx started last week.  Over the weekend he got worse, more sweats.  Cough, chest is tight, green sputum.  Temp up to 101.  No vomiting.  Some back pain, he attributed to laying in bed over the weekend.  Some wheeze.  Not usually needing SABA at baseline.  Fatigued.    Pain with a deep breath on L side of chest at baseline, since last PNA.    PMH and SH reviewed- h/o PNA in pleurisy in the past.   ROS: Per HPI unless specifically indicated in ROS section   Meds, vitals, and allergies reviewed.   GEN: nad, alert and oriented HEENT: mucous membranes moist, tm w/o erythema, nasal exam w/o erythema, clear discharge noted,  OP with cobblestoning NECK: supple w/o LA CV: rrr.   PULM: Wheeze, rhonchi, crackles on the LLL but ctab o/w, no inc wob EXT: no edema SKIN: no acute rash

## 2016-12-04 ENCOUNTER — Encounter: Payer: Self-pay | Admitting: Family Medicine

## 2016-12-04 NOTE — Assessment & Plan Note (Addendum)
Presumed, based on exam. Check chest x-ray today. Even if his chest x-ray is negative I would still treat him as presumed community-acquired pneumonia, it may be early enough in the process that the infiltrate may not be visible on chest x-ray. Discussed with patient. He agrees. Routine cautions given on steroids. We'll start prednisone given the wheeze he has noted. Use albuterol as needed. Start antibiotics, routine cautions given. Update [place] if not better. Still okay for outpatient follow-up but routine emergency cautions given to patient. He understood. See notes on imaging, images independently reviewed.

## 2017-01-01 IMAGING — CT CT CTA ABD/PEL W/CM AND/OR W/O CM
2 of 7 series · 12 of 46 positions shown, 14 images · IV contrast (OMNI 350)
Comparison: Chest radiograph performed earlier today at [DATE] a.m.

CLINICAL DATA: Acute onset of left-sided abdominal pain, radiating
to the left chest and left shoulder. Diarrhea. Nausea. Initial
encounter.

EXAM:
CT ANGIOGRAPHY CHEST, ABDOMEN AND PELVIS
TECHNIQUE: Multidetector CT imaging through the chest, abdomen and pelvis was
performed using the standard protocol during bolus administration of
intravenous contrast. Multiplanar reconstructed images and MIPs were
obtained and reviewed to evaluate the vascular anatomy.
CONTRAST:  100 mL of Omnipaque 350 IV contrast

[Series 7: dissection 2mm · axial · 0.88mm/px · z∈[+986,+1566]mm · 9 of 346 slices shown, 11 images]
[im 37/346  soft-tissue]
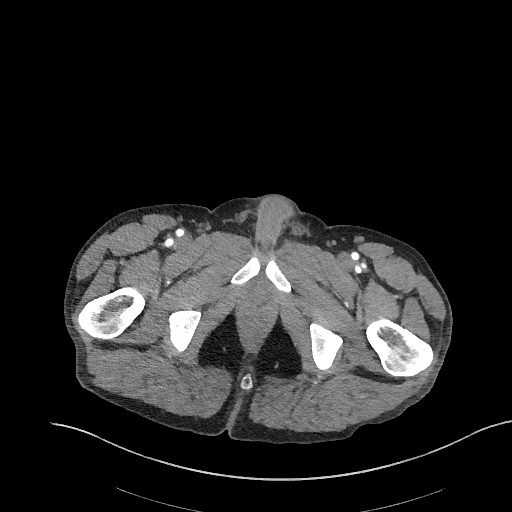
[im 37/346  bone]
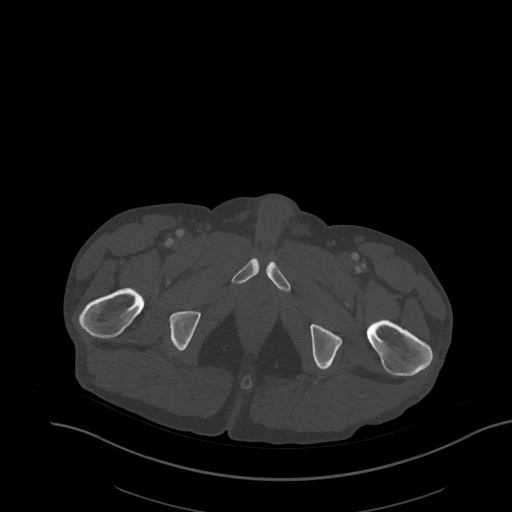
[im 73/346  soft-tissue]
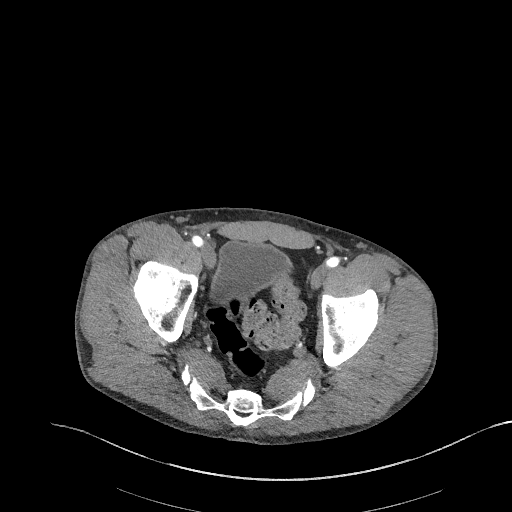
[im 109/346  soft-tissue]
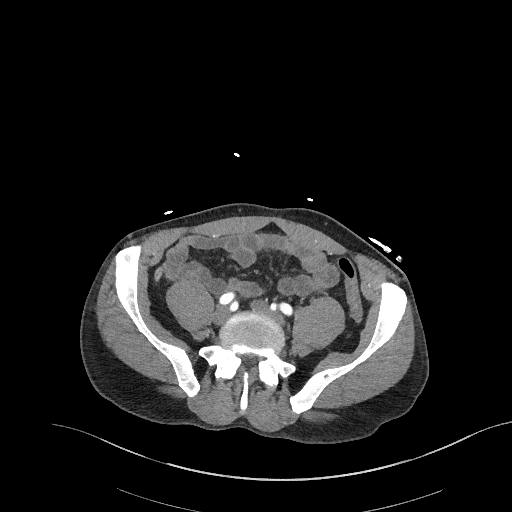
[im 146/346  soft-tissue]
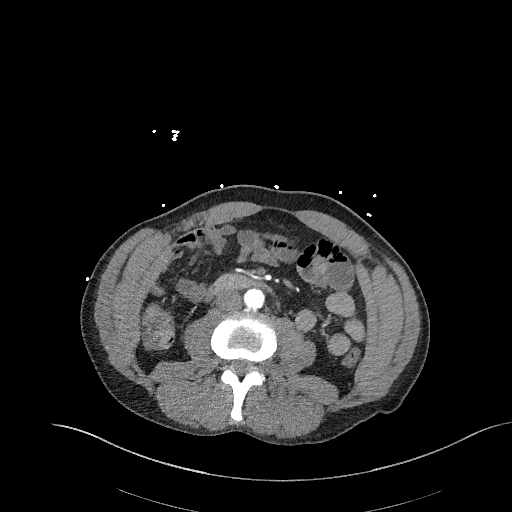
[im 182/346  soft-tissue]
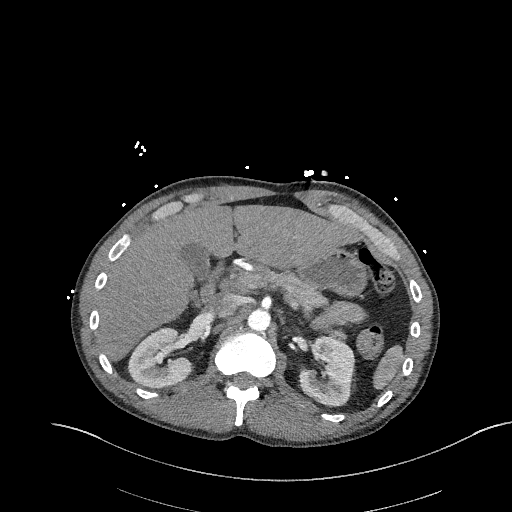
[im 218/346  soft-tissue]
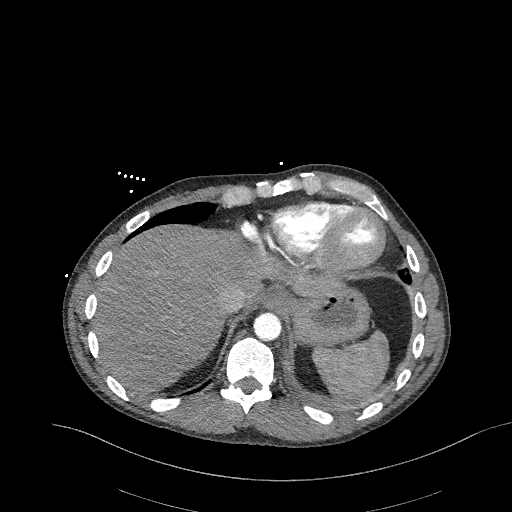
[im 255/346  soft-tissue]
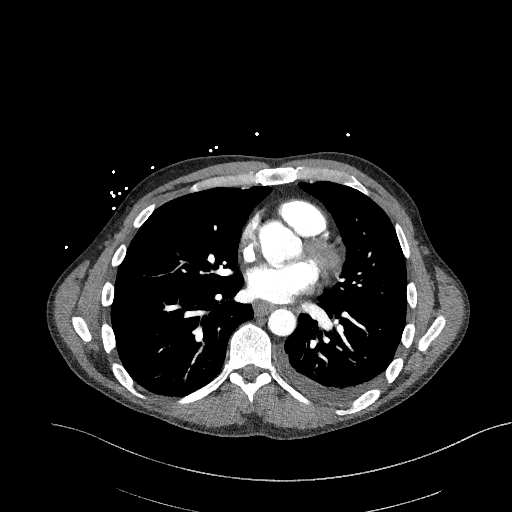
[im 291/346  soft-tissue]
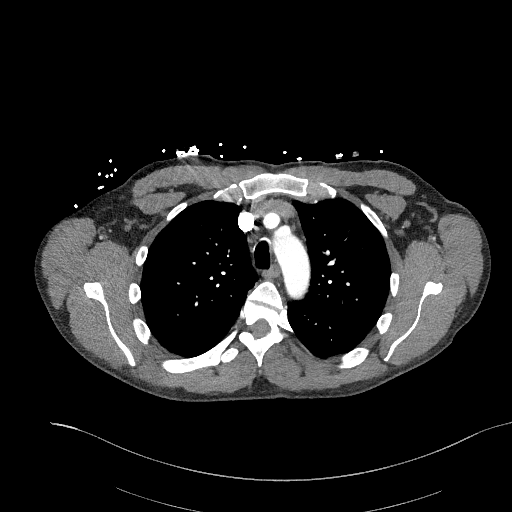
[im 327/346  soft-tissue]
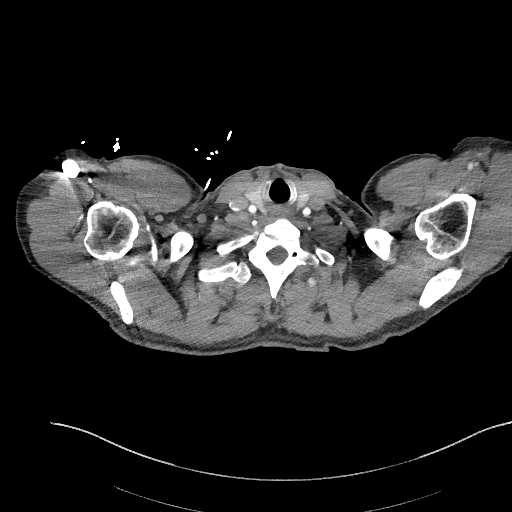
[im 327/346  bone]
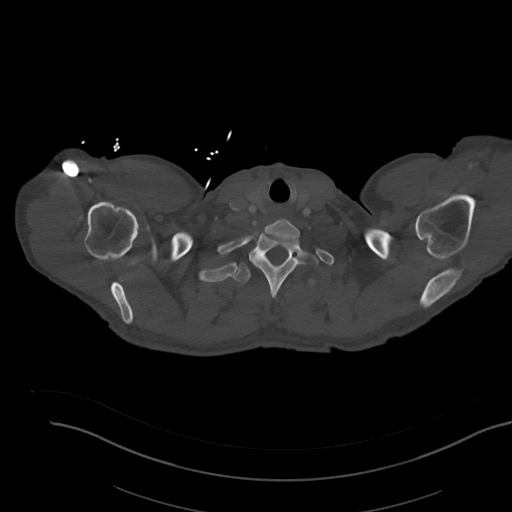

[Series 10: dissection 2mm cor · coronal · 0.70mm/px · 3 of 139 slices shown]
[im 35/139  soft-tissue]
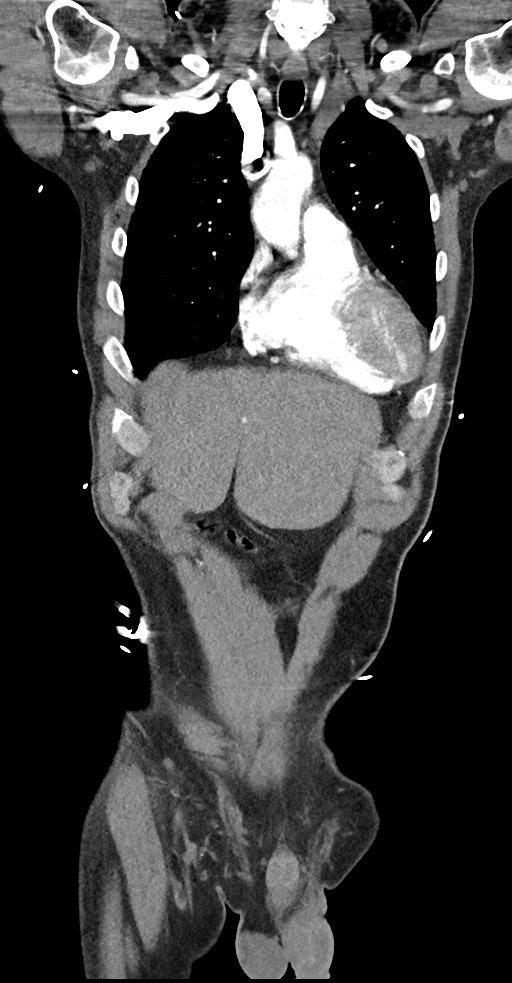
[im 70/139  soft-tissue]
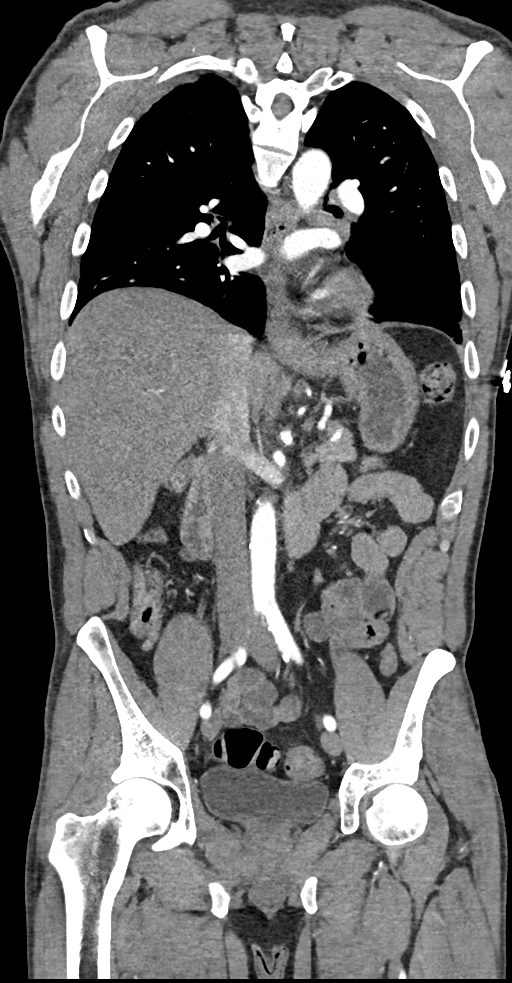
[im 104/139  soft-tissue]
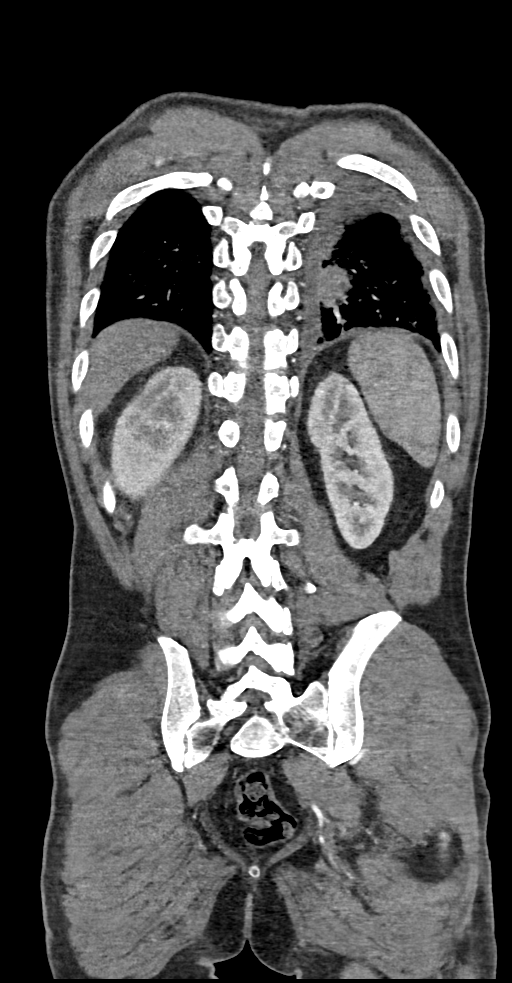

[12 of 46 positions shown; findings below may reference images not displayed]

FINDINGS: CTA CHEST FINDINGS

There is no evidence of aortic dissection. There is no evidence of
aneurysmal dilatation. No calcific atherosclerotic disease is seen.
The great vessels are unremarkable in appearance.

There is no evidence of pulmonary embolus.

There is somewhat unusual appearing focal airspace opacity at the
left lower lobe, without evidence of associated pulmonary embolus.
Given underlying hazy opacity within both lower lobes and a small
left pleural effusion, findings are thought to reflect mild
pulmonary edema. There is no evidence of pneumothorax. No masses are
identified; no abnormal focal contrast enhancement is seen.

A borderline prominent precarinal node is noted, measuring 1.2 cm in
short axis. No additional mediastinal lymphadenopathy is seen. No
pericardial effusion is identified. No axillary lymphadenopathy is
seen. The thyroid gland is unremarkable in appearance.

No acute osseous abnormalities are seen.

Review of the MIP images confirms the above findings.

CTA ABDOMEN AND PELVIS FINDINGS

There is no evidence of aortic dissection. There is no evidence of
aneurysmal dilatation. Mild calcification is noted along the
abdominal aorta and its branches. No luminal narrowing is seen. The
celiac trunk, superior mesenteric artery, bilateral renal arteries
and inferior mesenteric artery appear fully patent. The inferior
vena cava is grossly unremarkable in appearance.

The liver and spleen are unremarkable in appearance. The gallbladder
is within normal limits. The pancreas and adrenal glands are
unremarkable.

A 4.4 cm cyst is noted at the upper pole of the right kidney. The
kidneys are otherwise unremarkable. There is no evidence of
hydronephrosis. No renal or ureteral stones are seen. No perinephric
stranding is appreciated.

No free fluid is identified. The small bowel is unremarkable in
appearance. The stomach is within normal limits. No acute vascular
abnormalities are seen.

The appendix is normal in caliber, without evidence of appendicitis.
The colon is unremarkable in appearance.

The bladder is mildly distended and grossly unremarkable. The
prostate remains normal in size. No inguinal lymphadenopathy is
seen.

No acute osseous abnormalities are identified.

Review of the MIP images confirms the above findings.
IMPRESSION: 1. No evidence of aortic dissection. No evidence of aneurysmal
dilatation. Mild calcification along the abdominal aorta and its
branches, without evidence of luminal narrowing.
2. No evidence of pulmonary embolus at this time.
3. Somewhat unusual focal airspace opacity at the left lower lobe,
without evidence of associated pulmonary embolus. Given underlying
hazy opacity within both lower lobes and a small left pleural
effusion, findings are thought to reflect mild pulmonary edema.
Alternately, this could reflect a resolved small pulmonary embolus
with small pulmonary infarct, and subsequent pulmonary edema.
4. Borderline prominent precarinal node, measuring 1.2 cm in short
axis, nonspecific in appearance.
5. Right renal cyst noted.

## 2017-01-01 IMAGING — DX DG CHEST 2V
2 series · 2 of 2 positions shown · non-contrast
Comparison: Chest radiograph from 05/16/2011

CLINICAL DATA: Acute onset of left-sided chest and abdominal pain.
Left shoulder pain. Diarrhea. Initial encounter.

EXAM:
CHEST  2 VIEW

[chest pa]
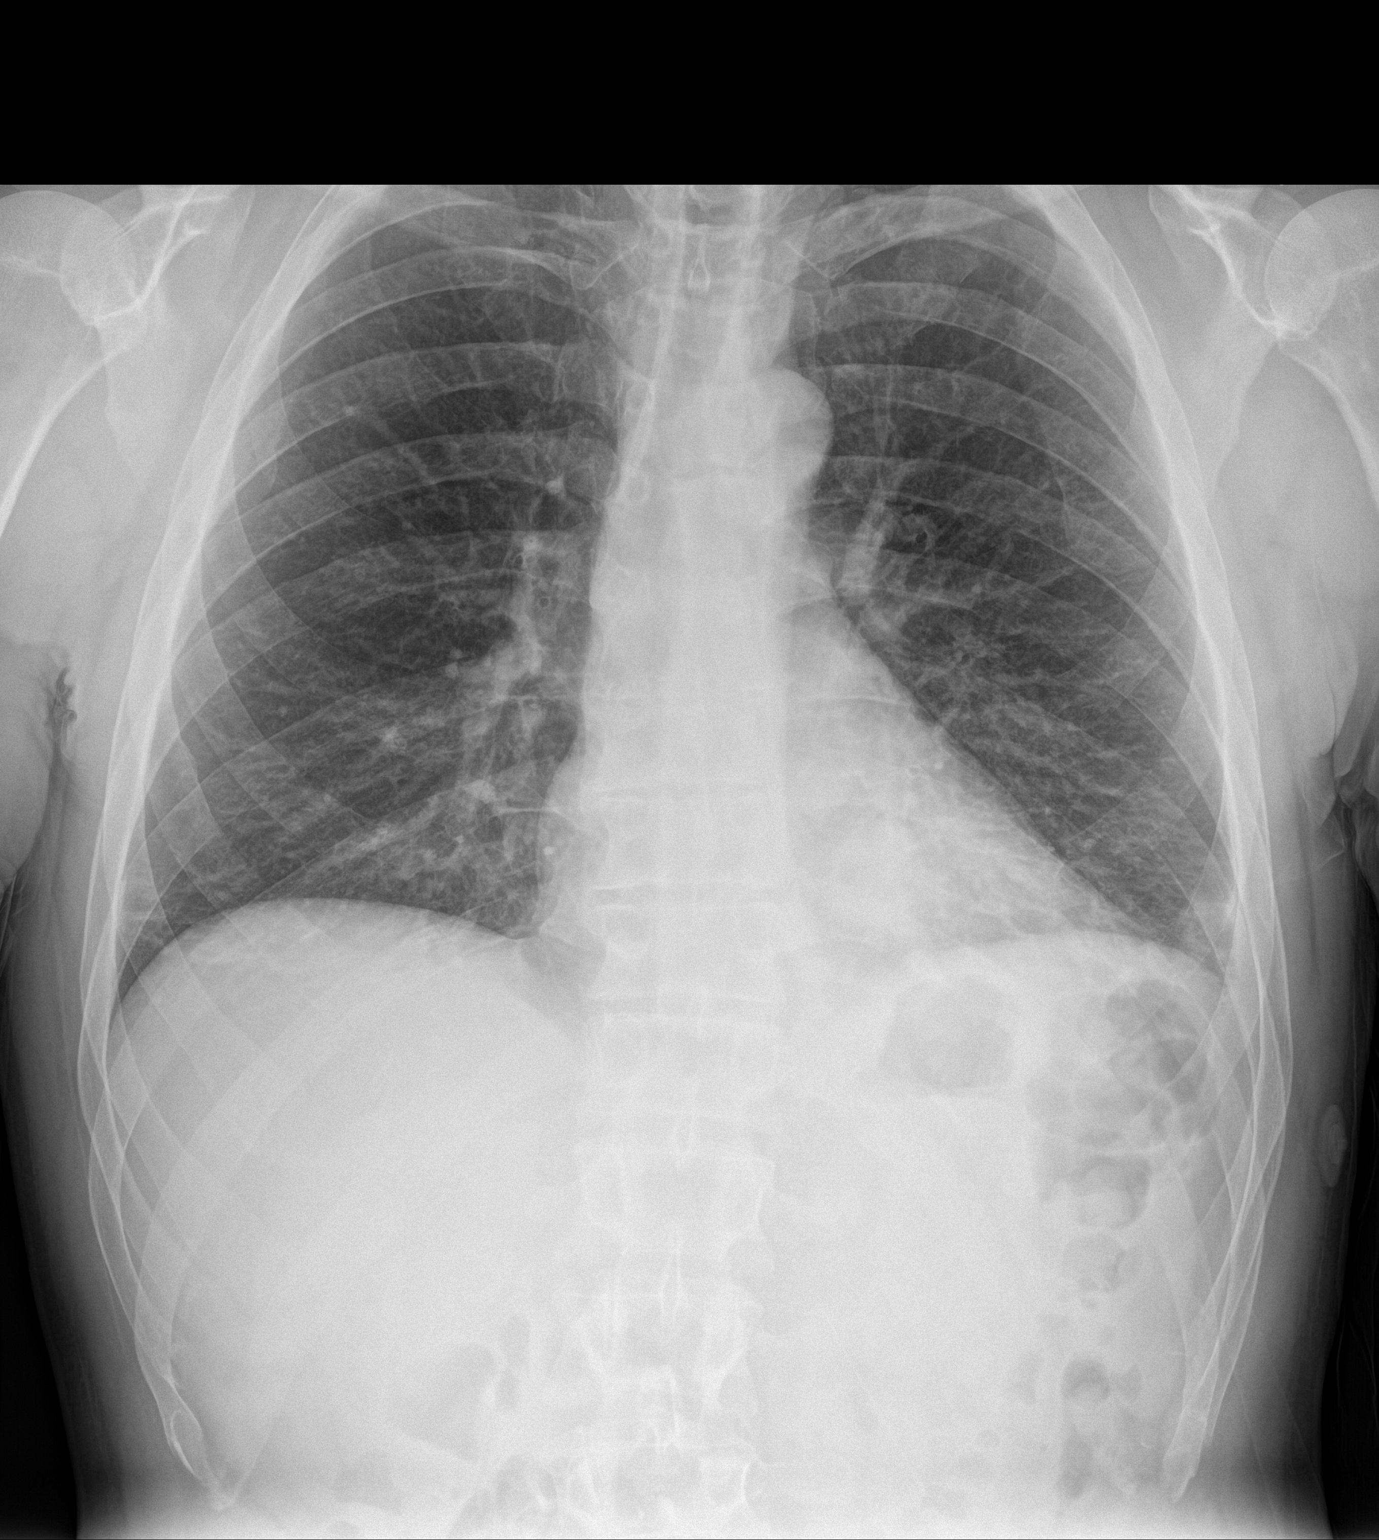

[chest lat]
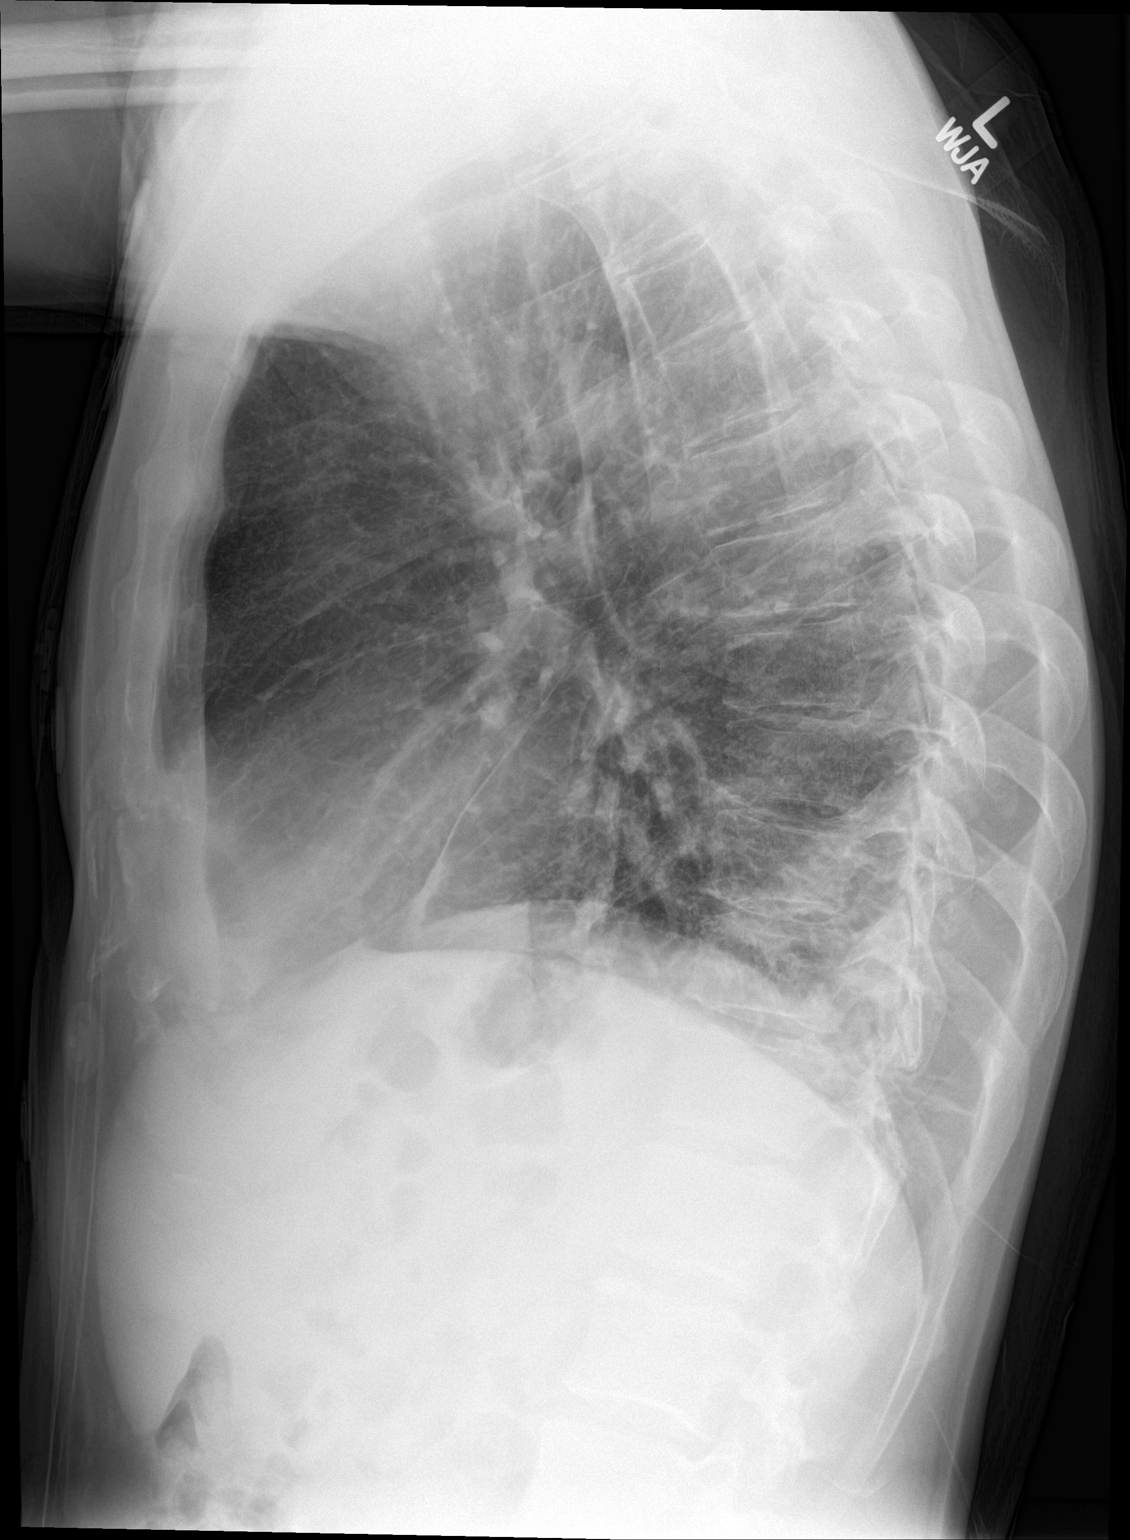

[2 of 2 positions shown; findings below may reference images not displayed]

FINDINGS: The lungs are well-aerated. Mild bibasilar opacities likely reflect
atelectasis. There is no evidence of pleural effusion or
pneumothorax.

The heart is normal in size; the mediastinal contour is within
normal limits. No acute osseous abnormalities are seen.
IMPRESSION: Mild bibasilar opacities likely reflect atelectasis. Lungs otherwise
clear.

## 2017-05-24 ENCOUNTER — Ambulatory Visit: Payer: 59 | Admitting: Family Medicine

## 2017-05-24 ENCOUNTER — Encounter: Payer: Self-pay | Admitting: Family Medicine

## 2017-05-24 ENCOUNTER — Other Ambulatory Visit: Payer: Self-pay

## 2017-05-24 DIAGNOSIS — F17200 Nicotine dependence, unspecified, uncomplicated: Secondary | ICD-10-CM | POA: Diagnosis not present

## 2017-05-24 DIAGNOSIS — J209 Acute bronchitis, unspecified: Secondary | ICD-10-CM | POA: Diagnosis not present

## 2017-05-24 DIAGNOSIS — I10 Essential (primary) hypertension: Secondary | ICD-10-CM

## 2017-05-24 MED ORDER — AZITHROMYCIN 250 MG PO TABS: ORAL_TABLET | ORAL | 0 refills | Status: DC

## 2017-05-24 MED ORDER — ALBUTEROL SULFATE HFA 108 (90 BASE) MCG/ACT IN AERS: 2.0000 | INHALATION_SPRAY | Freq: Four times a day (QID) | RESPIRATORY_TRACT | 2 refills | Status: DC | PRN

## 2017-05-24 NOTE — Progress Notes (Signed)
Subjective:    Patient ID: Roy Patel, male    DOB: 12/11/1960, 57 y.o.   MRN: [id number]  Fever   Associated symptoms include chest pain and coughing. Pertinent negatives include no ear pain, sore throat or wheezing.  Cough  This is a new problem. The current episode started in the past 7 days. The problem has been gradually improving. The cough is non-productive. Associated symptoms include chest pain, chills, a fever and nasal congestion. Pertinent negatives include no ear pain, myalgias, postnasal drip, sore throat, shortness of breath or wheezing. Associated symptoms comments: Low grade temp  decreased energy  chest congestion  chest pain with cough. Nothing aggravates the symptoms. Risk factors for lung disease include smoking/tobacco exposure. He has tried OTC cough suppressant ( nasal afrin spray) for the symptoms. The treatment provided mild relief. His past medical history is significant for pneumonia. There is no history of asthma, COPD or environmental allergies.    BP Readings from Last 3 Encounters:  05/24/17 (!) 164/80  12/03/16 130/72  05/09/16 138/78    Blood pressure (!) 164/80, pulse 80, temperature 98.2 F (36.8 C), temperature source Oral, height 5' 8.5" (1.74 m), weight 162 lb 12 oz (73.8 kg), SpO2 95 %. Social History /Family History/Past Medical History reviewed in detail and updated in EMR if needed.  Review of Systems  Constitutional: Positive for chills and fever.  HENT: Negative for ear pain, postnasal drip and sore throat.   Respiratory: Positive for cough. Negative for shortness of breath and wheezing.   Cardiovascular: Positive for chest pain.  Musculoskeletal: Negative for myalgias.  Allergic/Immunologic: Negative for environmental allergies.       Objective:   Physical Exam  Constitutional: Vital signs are normal. He appears well-developed and well-nourished.  Non-toxic appearance. He does not appear ill. No distress.  HENT:  Head:  Normocephalic and atraumatic.  Right Ear: Hearing, tympanic membrane, external ear and ear canal normal. No tenderness. No foreign bodies. Tympanic membrane is not retracted and not bulging.  Left Ear: Hearing, tympanic membrane, external ear and ear canal normal. No tenderness. No foreign bodies. Tympanic membrane is not retracted and not bulging.  Nose: Nose normal. No mucosal edema or rhinorrhea. Right sinus exhibits no maxillary sinus tenderness and no frontal sinus tenderness. Left sinus exhibits no maxillary sinus tenderness and no frontal sinus tenderness.  Mouth/Throat: Uvula is midline, oropharynx is clear and moist and mucous membranes are normal. Normal dentition. No dental caries. No oropharyngeal exudate or tonsillar abscesses.  Eyes: Conjunctivae, EOM and lids are normal. Pupils are equal, round, and reactive to light. Lids are everted and swept, no foreign bodies found.  Neck: Trachea normal, normal range of motion and phonation normal. Neck supple. Carotid bruit is not present. No thyroid mass and no thyromegaly present.  Cardiovascular: Normal rate, regular rhythm, S1 normal, S2 normal, normal heart sounds, intact distal pulses and normal pulses. Exam reveals no gallop.  No murmur heard. Pulmonary/Chest: Effort normal and breath sounds normal. No respiratory distress. He has no wheezes. He has no rhonchi. He has no rales.  Tympanic lung sounds  Abdominal: Soft. Normal appearance and bowel sounds are normal. There is no hepatosplenomegaly. There is no tenderness. There is no rebound, no guarding and no CVA tenderness. No hernia.  Neurological: He is alert. He has normal reflexes.  Skin: Skin is warm, dry and intact. No rash noted.  Psychiatric: He has a normal mood and affect. His speech is normal and behavior is normal.  Judgment normal.          Assessment & Plan:

## 2017-05-24 NOTE — Assessment & Plan Note (Signed)
Quit smoking. 

## 2017-05-24 NOTE — Patient Instructions (Addendum)
Make sure not using decongestant.  Follow BP at home.. Call  With BP measurements if > 140/90.  Quit smoking.  Rest, fluids.  Can use albuterol inhaler as needed.  If not  Continuing to improve as expected fill antibiotics.  Go to ER if severe shortness of breath.

## 2017-05-24 NOTE — Assessment & Plan Note (Addendum)
Likely viral etiology.  Symptomatic care.   Given weekend, pt with smoking history and possible COPD as well as history of pneumonia.. Will try course of antibiotics if not improving as expected.

## 2017-05-24 NOTE — Assessment & Plan Note (Signed)
Stop decongestant. Follow at home call PCP if  BPs not improving

## 2018-05-23 DIAGNOSIS — J069 Acute upper respiratory infection, unspecified: Secondary | ICD-10-CM | POA: Diagnosis not present

## 2018-06-02 DIAGNOSIS — J019 Acute sinusitis, unspecified: Secondary | ICD-10-CM | POA: Diagnosis not present

## 2018-06-10 ENCOUNTER — Other Ambulatory Visit: Payer: Self-pay | Admitting: Family Medicine

## 2019-01-26 ENCOUNTER — Other Ambulatory Visit: Payer: Self-pay | Admitting: Orthopedic Surgery

## 2019-01-26 DIAGNOSIS — M545 Low back pain, unspecified: Secondary | ICD-10-CM

## 2019-02-11 ENCOUNTER — Ambulatory Visit
Admission: RE | Admit: 2019-02-11 | Discharge: 2019-02-11 | Disposition: A | Discharge status: Home / Self Care | Payer: 59 | Source: Ambulatory Visit | Attending: Orthopedic Surgery | Admitting: Orthopedic Surgery

## 2019-02-11 ENCOUNTER — Other Ambulatory Visit: Payer: Self-pay | Admitting: Orthopedic Surgery

## 2019-02-11 ENCOUNTER — Other Ambulatory Visit: Payer: Self-pay

## 2019-02-11 DIAGNOSIS — M545 Low back pain, unspecified: Secondary | ICD-10-CM

## 2019-03-19 ENCOUNTER — Other Ambulatory Visit: Payer: Self-pay | Admitting: Physical Medicine and Rehabilitation

## 2019-03-19 DIAGNOSIS — M25511 Pain in right shoulder: Secondary | ICD-10-CM

## 2019-04-13 ENCOUNTER — Ambulatory Visit
Admission: RE | Admit: 2019-04-13 | Discharge: 2019-04-13 | Disposition: A | Discharge status: Home / Self Care | Payer: 59 | Source: Ambulatory Visit | Attending: Physical Medicine and Rehabilitation | Admitting: Physical Medicine and Rehabilitation

## 2019-04-13 DIAGNOSIS — M25511 Pain in right shoulder: Secondary | ICD-10-CM

## 2019-04-13 MED ORDER — IOPAMIDOL (ISOVUE-M 200) INJECTION 41%
15.0000 mL | Freq: Once | INTRAMUSCULAR | Status: AC
  Administered 2019-04-13: 15 mL via INTRA_ARTICULAR

## 2019-09-22 ENCOUNTER — Other Ambulatory Visit: Payer: Self-pay | Admitting: Family Medicine

## 2020-04-13 ENCOUNTER — Other Ambulatory Visit: Payer: Self-pay

## 2020-04-13 ENCOUNTER — Encounter (HOSPITAL_COMMUNITY): Payer: Self-pay | Admitting: Orthopedic Surgery

## 2020-04-13 DIAGNOSIS — M25411 Effusion, right shoulder: Secondary | ICD-10-CM | POA: Diagnosis present

## 2020-04-13 DIAGNOSIS — M25511 Pain in right shoulder: Secondary | ICD-10-CM | POA: Diagnosis present

## 2020-04-13 NOTE — Progress Notes (Signed)
PCP - Dr. Ethel Rana _ [place]  Cardiologist - denies  ERAS Protcol - 11:30 am  COVID TEST- DOS  Anesthesia review: n/a  -------------  SDW INSTRUCTIONS:  Your procedure is scheduled on 04/14/20 Thursday. Please report to [facility] Main Entrance "A" at 11:30 A.M., and check in at the Admitting office. Call this number if you have problems the morning of surgery: [phone]   Remember: Do not eat after midnight the night before your surgery  You may drink clear liquids until 11:30 A.M. the morning of your surgery.   Clear liquids allowed are: Water, Non-Citrus Juices (without pulp), Carbonated Beverages, Clear Tea, Black Coffee Only, and Gatorade   Medications to take morning of surgery with a sip of water include: Inhaler if needed --- Please bring all inhalers with you the day of surgery.  Imitrex if needed  As of today, STOP taking any Aspirin (unless otherwise instructed by your surgeon), Aleve, Naproxen, Ibuprofen, Motrin, Advil, Goody's, BC's, all herbal medications, fish oil, and all vitamins.    The Morning of Surgery Do not wear jewelry Do not wear lotions, powders, or perfumes, or deodorant Men may shave face and neck. Do not bring valuables to the hospital. [facility] is not responsible for any belongings or valuables. If you are a smoker, DO NOT Smoke 24 hours prior to surgery If you wear a CPAP at night please bring your mask the morning of surgery  Remember that you must have someone to transport you home after your surgery, and remain with you for 24 hours if you are discharged the same day. Please bring cases for contacts, glasses, hearing aids, dentures or bridgework because it cannot be worn into surgery.   Patients discharged the day of surgery will not be allowed to drive home.   Please shower the NIGHT BEFORE SURGERY and the MORNING OF SURGERY with DIAL Soap. Wear comfortable clothes the morning of surgery. Oral Hygiene is also important to reduce your  risk of infection.  Remember - BRUSH YOUR TEETH THE MORNING OF SURGERY WITH YOUR REGULAR TOOTHPASTE  Patient denies shortness of breath, fever, cough and chest pain.

## 2020-04-14 ENCOUNTER — Encounter (HOSPITAL_COMMUNITY)
Admission: RE | Disposition: A | Discharge status: Home Health | Payer: Self-pay | Source: Home / Self Care | Attending: Orthopedic Surgery

## 2020-04-14 ENCOUNTER — Encounter (HOSPITAL_COMMUNITY): Payer: Self-pay | Admitting: Orthopedic Surgery

## 2020-04-14 ENCOUNTER — Other Ambulatory Visit: Payer: Self-pay

## 2020-04-14 ENCOUNTER — Inpatient Hospital Stay (HOSPITAL_COMMUNITY): Payer: 59 | Admitting: Certified Registered Nurse Anesthetist

## 2020-04-14 ENCOUNTER — Inpatient Hospital Stay (HOSPITAL_COMMUNITY)
Admission: RE | Admit: 2020-04-14 | Discharge: 2020-04-16 | DRG: 507 | Disposition: A | Discharge status: Home Health | Payer: 59 | Attending: Orthopedic Surgery | Admitting: Orthopedic Surgery

## 2020-04-14 DIAGNOSIS — M25411 Effusion, right shoulder: Secondary | ICD-10-CM | POA: Diagnosis present

## 2020-04-14 DIAGNOSIS — R059 Cough, unspecified: Secondary | ICD-10-CM

## 2020-04-14 DIAGNOSIS — Z833 Family history of diabetes mellitus: Secondary | ICD-10-CM

## 2020-04-14 DIAGNOSIS — Z23 Encounter for immunization: Secondary | ICD-10-CM

## 2020-04-14 DIAGNOSIS — M25511 Pain in right shoulder: Secondary | ICD-10-CM | POA: Diagnosis present

## 2020-04-14 DIAGNOSIS — Z882 Allergy status to sulfonamides status: Secondary | ICD-10-CM

## 2020-04-14 DIAGNOSIS — M5430 Sciatica, unspecified side: Secondary | ICD-10-CM | POA: Diagnosis present

## 2020-04-14 DIAGNOSIS — Z20822 Contact with and (suspected) exposure to covid-19: Secondary | ICD-10-CM | POA: Diagnosis present

## 2020-04-14 DIAGNOSIS — I160 Hypertensive urgency: Secondary | ICD-10-CM | POA: Diagnosis present

## 2020-04-14 DIAGNOSIS — M009 Pyogenic arthritis, unspecified: Secondary | ICD-10-CM | POA: Diagnosis present

## 2020-04-14 DIAGNOSIS — T8469XA Infection and inflammatory reaction due to internal fixation device of other site, initial encounter: Principal | ICD-10-CM | POA: Diagnosis present

## 2020-04-14 DIAGNOSIS — I10 Essential (primary) hypertension: Secondary | ICD-10-CM | POA: Diagnosis present

## 2020-04-14 DIAGNOSIS — Z8249 Family history of ischemic heart disease and other diseases of the circulatory system: Secondary | ICD-10-CM

## 2020-04-14 DIAGNOSIS — F17210 Nicotine dependence, cigarettes, uncomplicated: Secondary | ICD-10-CM | POA: Diagnosis present

## 2020-04-14 DIAGNOSIS — Y792 Prosthetic and other implants, materials and accessory orthopedic devices associated with adverse incidents: Secondary | ICD-10-CM | POA: Diagnosis present

## 2020-04-14 DIAGNOSIS — T8142XA Infection following a procedure, deep incisional surgical site, initial encounter: Secondary | ICD-10-CM

## 2020-04-14 DIAGNOSIS — T8140XA Infection following a procedure, unspecified, initial encounter: Secondary | ICD-10-CM | POA: Diagnosis present

## 2020-04-14 DIAGNOSIS — Z880 Allergy status to penicillin: Secondary | ICD-10-CM

## 2020-04-14 HISTORY — PX: SHOULDER ARTHROSCOPY: SHX128

## 2020-04-14 HISTORY — PX: IRRIGATION AND DEBRIDEMENT SHOULDER: SHX5880

## 2020-04-14 LAB — CBC
HCT: 43.9 % (ref 39.0–52.0)
Hemoglobin: 14.9 g/dL (ref 13.0–17.0)
MCH: 33.6 pg (ref 26.0–34.0)
MCHC: 33.9 g/dL (ref 30.0–36.0)
MCV: 99.1 fL (ref 80.0–100.0)
Platelets: 325 10*3/uL (ref 150–400)
RBC: 4.43 MIL/uL (ref 4.22–5.81)
RDW: 12.3 % (ref 11.5–15.5)
WBC: 9.9 10*3/uL (ref 4.0–10.5)
nRBC: 0 % (ref 0.0–0.2)

## 2020-04-14 LAB — COMPREHENSIVE METABOLIC PANEL
ALT: 122 U/L — ABNORMAL HIGH (ref 0–44)
AST: 124 U/L — ABNORMAL HIGH (ref 15–41)
Albumin: 3.3 g/dL — ABNORMAL LOW (ref 3.5–5.0)
Alkaline Phosphatase: 105 U/L (ref 38–126)
Anion gap: 12 (ref 5–15)
BUN: 12 mg/dL (ref 6–20)
CO2: 22 mmol/L (ref 22–32)
Calcium: 9.9 mg/dL (ref 8.9–10.3)
Chloride: 102 mmol/L (ref 98–111)
Creatinine, Ser: 0.68 mg/dL (ref 0.61–1.24)
GFR, Estimated: >60 mL/min (ref >60)
Glucose, Bld: 105 mg/dL — ABNORMAL HIGH (ref 70–99)
Potassium: 4.1 mmol/L (ref 3.5–5.1)
Sodium: 136 mmol/L (ref 135–145)
Total Bilirubin: 0.8 mg/dL (ref 0.3–1.2)
Total Protein: 7.9 g/dL (ref 6.5–8.1)

## 2020-04-14 LAB — SURGICAL PCR SCREEN
MRSA, PCR: NEGATIVE
Staphylococcus aureus: POSITIVE — AB

## 2020-04-14 LAB — SARS CORONAVIRUS 2 BY RT PCR (HOSPITAL ORDER, PERFORMED IN ~~LOC~~ HOSPITAL LAB): SARS Coronavirus 2: NEGATIVE

## 2020-04-14 SURGERY — ARTHROSCOPY, SHOULDER
Anesthesia: General | Site: Shoulder | Laterality: Right

## 2020-04-14 MED ORDER — VANCOMYCIN HCL IN DEXTROSE 1-5 GM/200ML-% IV SOLN
1000.0000 mg | INTRAVENOUS | Status: AC
  Administered 2020-04-14: 1000 mg via INTRAVENOUS
  Filled 2020-04-14: qty 200

## 2020-04-14 MED ORDER — METOCLOPRAMIDE HCL 5 MG/ML IJ SOLN: 5.0000 mg | Freq: Three times a day (TID) | INTRAMUSCULAR | Status: DC | PRN

## 2020-04-14 MED ORDER — ORAL CARE MOUTH RINSE: 15.0000 mL | Freq: Once | OROMUCOSAL | Status: AC

## 2020-04-14 MED ORDER — POTASSIUM CHLORIDE IN NACL 20-0.45 MEQ/L-% IV SOLN
INTRAVENOUS | Status: DC
  Filled 2020-04-14: qty 1000

## 2020-04-14 MED ORDER — ACETAMINOPHEN 325 MG PO TABS
325.0000 mg | ORAL_TABLET | Freq: Four times a day (QID) | ORAL | Status: DC | PRN
  Administered 2020-04-15: 325 mg via ORAL
  Filled 2020-04-14: qty 2

## 2020-04-14 MED ORDER — FENTANYL CITRATE (PF) 100 MCG/2ML IJ SOLN: 25.0000 ug | INTRAMUSCULAR | Status: DC | PRN

## 2020-04-14 MED ORDER — MIDAZOLAM HCL 2 MG/2ML IJ SOLN
INTRAMUSCULAR | Status: AC
  Administered 2020-04-14: 2 mg via INTRAVENOUS
  Filled 2020-04-14: qty 2

## 2020-04-14 MED ORDER — BUPIVACAINE LIPOSOME 1.3 % IJ SUSP
INTRAMUSCULAR | Status: DC | PRN
  Administered 2020-04-14: 10 mL via PERINEURAL

## 2020-04-14 MED ORDER — HYDROCODONE-ACETAMINOPHEN 5-325 MG PO TABS
1.0000 | ORAL_TABLET | ORAL | Status: DC | PRN
  Administered 2020-04-14: 2 via ORAL
  Filled 2020-04-14: qty 2

## 2020-04-14 MED ORDER — DOCUSATE SODIUM 100 MG PO CAPS
100.0000 mg | ORAL_CAPSULE | Freq: Two times a day (BID) | ORAL | Status: DC
  Administered 2020-04-14 – 2020-04-16 (×4): 100 mg via ORAL
  Filled 2020-04-14 (×4): qty 1

## 2020-04-14 MED ORDER — MIDAZOLAM HCL 2 MG/2ML IJ SOLN: 2.0000 mg | Freq: Once | INTRAMUSCULAR | Status: AC

## 2020-04-14 MED ORDER — BISACODYL 10 MG RE SUPP: 10.0000 mg | Freq: Every day | RECTAL | Status: DC | PRN

## 2020-04-14 MED ORDER — POLYETHYLENE GLYCOL 3350 17 G PO PACK: 17.0000 g | PACK | Freq: Every day | ORAL | Status: DC | PRN

## 2020-04-14 MED ORDER — PIPERACILLIN-TAZOBACTAM 3.375 G IVPB
3.3750 g | INTRAVENOUS | Status: AC
  Administered 2020-04-14: 3.375 g via INTRAVENOUS
  Filled 2020-04-14: qty 50

## 2020-04-14 MED ORDER — FENTANYL CITRATE (PF) 100 MCG/2ML IJ SOLN
INTRAMUSCULAR | Status: AC
  Administered 2020-04-14: 100 ug via INTRAVENOUS
  Filled 2020-04-14: qty 2

## 2020-04-14 MED ORDER — MEPERIDINE HCL 25 MG/ML IJ SOLN: 6.2500 mg | INTRAMUSCULAR | Status: DC | PRN

## 2020-04-14 MED ORDER — DIPHENHYDRAMINE HCL 12.5 MG/5ML PO ELIX
12.5000 mg | ORAL_SOLUTION | ORAL | Status: DC | PRN
  Administered 2020-04-14 – 2020-04-15 (×2): 25 mg via ORAL
  Filled 2020-04-14 (×2): qty 10

## 2020-04-14 MED ORDER — FENTANYL CITRATE (PF) 100 MCG/2ML IJ SOLN
100.0000 ug | Freq: Once | INTRAMUSCULAR | Status: AC
Start: 2020-04-14 — End: 2020-04-14

## 2020-04-14 MED ORDER — PIPERACILLIN-TAZOBACTAM 3.375 G IVPB
3.3750 g | Freq: Three times a day (TID) | INTRAVENOUS | Status: DC
  Administered 2020-04-14 – 2020-04-15 (×2): 3.375 g via INTRAVENOUS
  Filled 2020-04-14 (×2): qty 50

## 2020-04-14 MED ORDER — CHLORHEXIDINE GLUCONATE 0.12 % MT SOLN: 15.0000 mL | Freq: Once | OROMUCOSAL | Status: AC

## 2020-04-14 MED ORDER — EPHEDRINE 5 MG/ML INJ
INTRAVENOUS | Status: AC
  Filled 2020-04-14: qty 10

## 2020-04-14 MED ORDER — MAGNESIUM CITRATE PO SOLN: 1.0000 | Freq: Once | ORAL | Status: DC | PRN

## 2020-04-14 MED ORDER — METHOCARBAMOL 500 MG PO TABS
500.0000 mg | ORAL_TABLET | Freq: Four times a day (QID) | ORAL | Status: DC | PRN
  Administered 2020-04-14: 500 mg via ORAL
  Filled 2020-04-14: qty 1

## 2020-04-14 MED ORDER — OXYCODONE HCL 5 MG/5ML PO SOLN
5.0000 mg | Freq: Once | ORAL | Status: DC | PRN
Start: 2020-04-14 — End: 2020-04-14

## 2020-04-14 MED ORDER — MIDAZOLAM HCL 2 MG/2ML IJ SOLN
INTRAMUSCULAR | Status: AC
  Filled 2020-04-14: qty 2

## 2020-04-14 MED ORDER — SUCCINYLCHOLINE CHLORIDE 200 MG/10ML IV SOSY
PREFILLED_SYRINGE | INTRAVENOUS | Status: DC | PRN
  Administered 2020-04-14: 120 mg via INTRAVENOUS

## 2020-04-14 MED ORDER — PROPOFOL 10 MG/ML IV BOLUS
INTRAVENOUS | Status: DC | PRN
  Administered 2020-04-14: 20 mg via INTRAVENOUS

## 2020-04-14 MED ORDER — OXYCODONE HCL 5 MG PO TABS: 5.0000 mg | ORAL_TABLET | Freq: Once | ORAL | Status: DC | PRN

## 2020-04-14 MED ORDER — HYDROCODONE-ACETAMINOPHEN 7.5-325 MG PO TABS
1.0000 | ORAL_TABLET | ORAL | Status: DC | PRN
  Administered 2020-04-15 (×2): 2 via ORAL
  Filled 2020-04-14 (×2): qty 2

## 2020-04-14 MED ORDER — EPHEDRINE SULFATE-NACL 50-0.9 MG/10ML-% IV SOSY
PREFILLED_SYRINGE | INTRAVENOUS | Status: DC | PRN
  Administered 2020-04-14: 10 mg via INTRAVENOUS

## 2020-04-14 MED ORDER — ONDANSETRON HCL 4 MG PO TABS: 4.0000 mg | ORAL_TABLET | Freq: Four times a day (QID) | ORAL | Status: DC | PRN

## 2020-04-14 MED ORDER — SUMATRIPTAN SUCCINATE 6 MG/0.5ML ~~LOC~~ SOLN: 6.0000 mg | SUBCUTANEOUS | Status: DC | PRN

## 2020-04-14 MED ORDER — PHENYLEPHRINE HCL-NACL 10-0.9 MG/250ML-% IV SOLN
INTRAVENOUS | Status: DC | PRN
  Administered 2020-04-14: 25 ug/min via INTRAVENOUS

## 2020-04-14 MED ORDER — PROMETHAZINE HCL 25 MG/ML IJ SOLN: 6.2500 mg | INTRAMUSCULAR | Status: DC | PRN

## 2020-04-14 MED ORDER — MIDAZOLAM HCL 2 MG/2ML IJ SOLN
INTRAMUSCULAR | Status: DC | PRN
  Administered 2020-04-14: 2 mg via INTRAVENOUS

## 2020-04-14 MED ORDER — SODIUM CHLORIDE 0.9 % IR SOLN
Status: DC | PRN
  Administered 2020-04-14: 15000 mL

## 2020-04-14 MED ORDER — LACTATED RINGERS IV SOLN: INTRAVENOUS | Status: DC

## 2020-04-14 MED ORDER — DEXAMETHASONE SODIUM PHOSPHATE 10 MG/ML IJ SOLN
INTRAMUSCULAR | Status: DC | PRN
  Administered 2020-04-14: 8 mg via INTRAVENOUS

## 2020-04-14 MED ORDER — METOCLOPRAMIDE HCL 5 MG PO TABS: 5.0000 mg | ORAL_TABLET | Freq: Three times a day (TID) | ORAL | Status: DC | PRN

## 2020-04-14 MED ORDER — BUPIVACAINE HCL (PF) 0.5 % IJ SOLN
INTRAMUSCULAR | Status: DC | PRN
  Administered 2020-04-14: 10 mL via PERINEURAL

## 2020-04-14 MED ORDER — LIDOCAINE 2% (20 MG/ML) 5 ML SYRINGE
INTRAMUSCULAR | Status: DC | PRN
  Administered 2020-04-14: 100 mg via INTRAVENOUS

## 2020-04-14 MED ORDER — ACETAMINOPHEN 500 MG PO TABS
1000.0000 mg | ORAL_TABLET | Freq: Once | ORAL | Status: AC
  Administered 2020-04-14: 1000 mg via ORAL
  Filled 2020-04-14: qty 2

## 2020-04-14 MED ORDER — ACETAMINOPHEN 500 MG PO TABS
500.0000 mg | ORAL_TABLET | Freq: Four times a day (QID) | ORAL | Status: AC
  Administered 2020-04-14 – 2020-04-15 (×3): 500 mg via ORAL
  Filled 2020-04-14 (×3): qty 1

## 2020-04-14 MED ORDER — ONDANSETRON HCL 4 MG/2ML IJ SOLN
INTRAMUSCULAR | Status: AC
  Filled 2020-04-14: qty 2

## 2020-04-14 MED ORDER — ALBUTEROL SULFATE (2.5 MG/3ML) 0.083% IN NEBU: 2.5000 mg | INHALATION_SOLUTION | RESPIRATORY_TRACT | Status: DC | PRN

## 2020-04-14 MED ORDER — MORPHINE SULFATE (PF) 2 MG/ML IV SOLN
0.5000 mg | INTRAVENOUS | Status: DC | PRN
  Administered 2020-04-15 – 2020-04-16 (×3): 1 mg via INTRAVENOUS
  Filled 2020-04-14 (×5): qty 1

## 2020-04-14 MED ORDER — ONDANSETRON HCL 4 MG/2ML IJ SOLN
INTRAMUSCULAR | Status: DC | PRN
  Administered 2020-04-14: 2 mg via INTRAVENOUS

## 2020-04-14 MED ORDER — VANCOMYCIN HCL IN DEXTROSE 1-5 GM/200ML-% IV SOLN
1000.0000 mg | Freq: Two times a day (BID) | INTRAVENOUS | Status: DC
  Administered 2020-04-15 – 2020-04-16 (×4): 1000 mg via INTRAVENOUS
  Filled 2020-04-14 (×5): qty 200

## 2020-04-14 MED ORDER — FENTANYL CITRATE (PF) 250 MCG/5ML IJ SOLN
INTRAMUSCULAR | Status: AC
  Filled 2020-04-14: qty 5

## 2020-04-14 MED ORDER — ONDANSETRON HCL 4 MG/2ML IJ SOLN: 4.0000 mg | Freq: Four times a day (QID) | INTRAMUSCULAR | Status: DC | PRN

## 2020-04-14 MED ORDER — CHLORHEXIDINE GLUCONATE 0.12 % MT SOLN
OROMUCOSAL | Status: AC
  Administered 2020-04-14: 15 mL via OROMUCOSAL
  Filled 2020-04-14: qty 15

## 2020-04-14 MED ORDER — HYDROMORPHONE HCL 1 MG/ML IJ SOLN: 0.2500 mg | INTRAMUSCULAR | Status: DC | PRN

## 2020-04-14 MED ORDER — FENTANYL CITRATE (PF) 250 MCG/5ML IJ SOLN
INTRAMUSCULAR | Status: DC | PRN
  Administered 2020-04-14: 50 ug via INTRAVENOUS

## 2020-04-14 MED ORDER — ACETAMINOPHEN 500 MG PO TABS: 1000.0000 mg | ORAL_TABLET | Freq: Once | ORAL | Status: DC

## 2020-04-14 MED ORDER — LIDOCAINE 2% (20 MG/ML) 5 ML SYRINGE
INTRAMUSCULAR | Status: AC
  Filled 2020-04-14: qty 5

## 2020-04-14 MED ORDER — DEXAMETHASONE SODIUM PHOSPHATE 10 MG/ML IJ SOLN
INTRAMUSCULAR | Status: AC
  Filled 2020-04-14: qty 1

## 2020-04-14 MED ORDER — METHOCARBAMOL 1000 MG/10ML IJ SOLN
500.0000 mg | Freq: Four times a day (QID) | INTRAVENOUS | Status: DC | PRN
  Filled 2020-04-14: qty 5

## 2020-04-14 MED ORDER — SENNA 8.6 MG PO TABS
1.0000 | ORAL_TABLET | Freq: Two times a day (BID) | ORAL | Status: DC
  Administered 2020-04-14 – 2020-04-16 (×4): 8.6 mg via ORAL
  Filled 2020-04-14 (×4): qty 1

## 2020-04-14 MED ORDER — SUCCINYLCHOLINE CHLORIDE 200 MG/10ML IV SOSY
PREFILLED_SYRINGE | INTRAVENOUS | Status: AC
  Filled 2020-04-14: qty 10

## 2020-04-14 MED ORDER — CHLORHEXIDINE GLUCONATE 4 % EX LIQD: 60.0000 mL | Freq: Once | CUTANEOUS | Status: DC

## 2020-04-14 SURGICAL SUPPLY — 72 items
BLADE CUTTER GATOR 3.5 (BLADE) ×2 IMPLANT
BLADE SURG 15 STRL LF DISP TIS (BLADE) IMPLANT
BLADE SURG 15 STRL SS (BLADE)
BOOTCOVER CLEANROOM LRG (PROTECTIVE WEAR) ×8 IMPLANT
CANISTER SUCT 3000ML PPV (MISCELLANEOUS) IMPLANT
CANNULA SHOULDER 7CM (CANNULA) ×2 IMPLANT
CANNULA TWIST IN 8.25X7CM (CANNULA) ×1 IMPLANT
CNTNR URN SCR LID CUP LEK RST (MISCELLANEOUS) IMPLANT
CONT SPEC 4OZ STRL OR WHT (MISCELLANEOUS) ×4
COVER SURGICAL LIGHT HANDLE (MISCELLANEOUS) ×2 IMPLANT
COVER WAND RF STERILE (DRAPES) ×2 IMPLANT
DRAPE HALF SHEET 40X57 (DRAPES) ×2 IMPLANT
DRAPE IMP U-DRAPE 54X76 (DRAPES) ×2 IMPLANT
DRAPE INCISE IOBAN 66X45 STRL (DRAPES) IMPLANT
DRAPE ORTHO SPLIT 77X108 STRL (DRAPES) ×4
DRAPE SHOULDER BEACH CHAIR (DRAPES) ×2 IMPLANT
DRAPE STERI 35X30 U-POUCH (DRAPES) ×2 IMPLANT
DRAPE SURG ORHT 6 SPLT 77X108 (DRAPES) ×2 IMPLANT
DRAPE U-SHAPE 47X51 STRL (DRAPES) ×2 IMPLANT
DRSG EMULSION OIL 3X3 NADH (GAUZE/BANDAGES/DRESSINGS) ×4 IMPLANT
DRSG MEPILEX BORDER 4X8 (GAUZE/BANDAGES/DRESSINGS) IMPLANT
DRSG PAD ABDOMINAL 8X10 ST (GAUZE/BANDAGES/DRESSINGS) ×4 IMPLANT
DURAPREP 26ML APPLICATOR (WOUND CARE) ×2 IMPLANT
ELECT REM PT RETURN 9FT ADLT (ELECTROSURGICAL) ×2
ELECTRODE REM PT RTRN 9FT ADLT (ELECTROSURGICAL) ×1 IMPLANT
EVACUATOR 1/8 PVC DRAIN (DRAIN) IMPLANT
EXCALIBUR 3.8MM X 13CM (MISCELLANEOUS) ×1 IMPLANT
GAUZE SPONGE 4X4 12PLY STRL (GAUZE/BANDAGES/DRESSINGS) ×2 IMPLANT
GAUZE XEROFORM 1X8 LF (GAUZE/BANDAGES/DRESSINGS) ×2 IMPLANT
GLOVE BIO SURGEON STRL SZ7 (GLOVE) ×2 IMPLANT
GLOVE BIOGEL PI IND STRL 7.0 (GLOVE) ×1 IMPLANT
GLOVE BIOGEL PI IND STRL 8 (GLOVE) ×1 IMPLANT
GLOVE BIOGEL PI INDICATOR 7.0 (GLOVE) ×1
GLOVE BIOGEL PI INDICATOR 8 (GLOVE) ×1
GLOVE ORTHO TXT STRL SZ7.5 (GLOVE) ×2 IMPLANT
GLOVE SURG ORTHO 8.0 STRL STRW (GLOVE) ×4 IMPLANT
GOWN STRL REUS W/ TWL LRG LVL3 (GOWN DISPOSABLE) ×1 IMPLANT
GOWN STRL REUS W/ TWL XL LVL3 (GOWN DISPOSABLE) ×1 IMPLANT
GOWN STRL REUS W/TWL 2XL LVL3 (GOWN DISPOSABLE) ×2 IMPLANT
GOWN STRL REUS W/TWL LRG LVL3 (GOWN DISPOSABLE) ×4
GOWN STRL REUS W/TWL XL LVL3 (GOWN DISPOSABLE) ×2
HANDPIECE INTERPULSE COAX TIP (DISPOSABLE)
KIT BASIN OR (CUSTOM PROCEDURE TRAY) ×2 IMPLANT
KIT TURNOVER KIT B (KITS) ×2 IMPLANT
MANIFOLD NEPTUNE II (INSTRUMENTS) ×2 IMPLANT
NS IRRIG 1000ML POUR BTL (IV SOLUTION) ×2 IMPLANT
PACK ARTHROSCOPY DSU (CUSTOM PROCEDURE TRAY) ×2 IMPLANT
PACK SHOULDER (CUSTOM PROCEDURE TRAY) ×2 IMPLANT
PAD ARMBOARD 7.5X6 YLW CONV (MISCELLANEOUS) ×4 IMPLANT
PENCIL BUTTON HOLSTER BLD 10FT (ELECTRODE) IMPLANT
SET HNDPC FAN SPRY TIP SCT (DISPOSABLE) IMPLANT
SLING ARM FOAM STRAP LRG (SOFTGOODS) IMPLANT
SLING ARM IMMOBILIZER LRG (SOFTGOODS) ×1 IMPLANT
SLING ARM IMMOBILIZER MED (SOFTGOODS) IMPLANT
SPONGE LAP 18X18 RF (DISPOSABLE) ×2 IMPLANT
SPONGE LAP 4X18 RFD (DISPOSABLE) ×2 IMPLANT
SUPPORT WRAP ARM LG (MISCELLANEOUS) ×1 IMPLANT
SUT ETHILON 3 0 PS 1 (SUTURE) IMPLANT
SUT VIC AB 0 CT1 27 (SUTURE)
SUT VIC AB 0 CT1 27XBRD ANBCTR (SUTURE) IMPLANT
SUT VIC AB 2-0 CT1 27 (SUTURE)
SUT VIC AB 2-0 CT1 TAPERPNT 27 (SUTURE) IMPLANT
SWAB COLLECTION DEVICE MRSA (MISCELLANEOUS) ×2 IMPLANT
SWAB CULTURE ESWAB REG 1ML (MISCELLANEOUS) ×1 IMPLANT
SYR 10ML LL (SYRINGE) ×1 IMPLANT
TOWEL GREEN STERILE (TOWEL DISPOSABLE) ×2 IMPLANT
TOWEL GREEN STERILE FF (TOWEL DISPOSABLE) ×2 IMPLANT
TUBE CONNECTING 12X1/4 (SUCTIONS) ×3 IMPLANT
TUBING ARTHROSCOPY IRRIG 16FT (MISCELLANEOUS) ×2 IMPLANT
UNDERPAD 30X36 HEAVY ABSORB (UNDERPADS AND DIAPERS) ×2 IMPLANT
WATER STERILE IRR 1000ML POUR (IV SOLUTION) ×2 IMPLANT
YANKAUER SUCT BULB TIP NO VENT (SUCTIONS) ×1 IMPLANT

## 2020-04-14 NOTE — Anesthesia Preprocedure Evaluation (Addendum)
Anesthesia Evaluation  Patient identified by MRN, date of birth, ID band Patient awake    Reviewed: Allergy & Precautions, NPO status , Patient's Chart, lab work & pertinent test results  Airway Mallampati: II  TM Distance: >3 FB Neck ROM: Full    Dental  (+) Dental Advisory Given, Poor Dentition, Chipped, Missing   Pulmonary Current Smoker,    Pulmonary exam normal breath sounds clear to auscultation       Cardiovascular hypertension (no meds- very uncontrolled in preop 189/94), Normal cardiovascular exam Rhythm:Regular Rate:Normal     Neuro/Psych  Headaches, negative psych ROS   GI/Hepatic negative GI ROS, (+) Hepatitis -  Endo/Other  negative endocrine ROS  Renal/GU negative Renal ROS     Musculoskeletal negative musculoskeletal ROS (+)   Abdominal   Peds  Hematology negative hematology ROS (+)   Anesthesia Other Findings Right septic shoulder   G6PD deficiency   Reproductive/Obstetrics                         Anesthesia Physical Anesthesia Plan  ASA: III  Anesthesia Plan: General   Post-op Pain Management: GA combined w/ Regional for post-op pain   Induction: Intravenous  PONV Risk Score and Plan: 1 and Ondansetron, Dexamethasone, Treatment may vary due to age or medical condition and Midazolam  Airway Management Planned: Oral ETT  Additional Equipment: None  Intra-op Plan:   Post-operative Plan: Extubation in OR  Informed Consent: I have reviewed the patients History and Physical, chart, labs and discussed the procedure including the risks, benefits and alternatives for the proposed anesthesia with the patient or authorized representative who has indicated his/her understanding and acceptance.     Dental advisory given  Plan Discussed with: CRNA  Anesthesia Plan Comments:       Anesthesia Quick Evaluation

## 2020-04-14 NOTE — Progress Notes (Signed)
Pharmacy Antibiotic Note  Roy Patel is a 59 y.o. male admitted on 04/14/2020 with surgical wound infection after recent shoulder rotator cuff repair now s/p debridement. Pharmacy has been consulted for vancomycin and Zosyn dosing.  Plan: Vancomycin 1000mg  q12h Zosyn 3.375g IV EI q8h Follow tissue cultures, renal function, LOT Vancomycin levels as needed   Height: 5\' 9"  (175.3 cm) Weight: 70.3 kg (155 lb) IBW/kg (Calculated) : 70.7  Temp (24hrs), Avg:97.7 F (36.5 C), Min:97 F (36.1 C), Max:98.4 F (36.9 C)  Recent Labs  Lab 04/14/20 1211  WBC 9.9  CREATININE 0.68    Estimated Creatinine Clearance: 98.9 mL/min (by C-G formula based on SCr of 0.68 mg/dL).    Allergies  Allergen Reactions  . Augmentin [Amoxicillin-Pot Clavulanate] Nausea And Vomiting    "throws up violently"  . Chlorthalidone Other (See Comments)    Felt bad/lethargic  . Sulfa Antibiotics Other (See Comments)    Jaundice    Antimicrobials this admission: Vancomycin 12/30 >>  Zosyn 12/30 >>     Microbiology results: pending  Thank you for allowing pharmacy to be a part of this patient's care.  1/31, PharmD, BCPS, [facility] Clinical Pharmacist [phone] Please check AMION for all [facility] Pharmacy numbers 04/14/2020

## 2020-04-14 NOTE — Anesthesia Procedure Notes (Signed)
Anesthesia Regional Block: Interscalene brachial plexus block   Pre-Anesthetic Checklist: ,, timeout performed, Correct Patient, Correct Site, Correct Laterality, Correct Procedure, Correct Position, site marked, Risks and benefits discussed,  Surgical consent,  Pre-op evaluation,  At surgeon's request and post-op pain management  Laterality: Right  Prep: chloraprep       Needles:  Injection technique: Single-shot  Needle Type: Stimulator Needle - 40     Needle Length: 4cm  Needle Gauge: 22     Additional Needles:   Procedures:,,,, ultrasound used (permanent image in chart),,,,  Narrative:  Start time: 04/14/2020 2:42 PM End time: 04/14/2020 2:47 PM Injection made incrementally with aspirations every 5 mL.  Performed by: Personally  Anesthesiologist: Lewie Loron, MD  Additional Notes: BP cuff, EKG monitors applied. Sedation begun. Nerve location verified with U/S. Anesthetic injected incrementally, slowly , and after neg aspirations under direct u/s guidance. Good perineural spread. Tolerated well.

## 2020-04-14 NOTE — Anesthesia Procedure Notes (Signed)
Procedure Name: Intubation Date/Time: 04/14/2020 3:29 PM Performed by: Modena Morrow, CRNA Pre-anesthesia Checklist: Patient identified, Emergency Drugs available, Suction available and Patient being monitored Patient Re-evaluated:Patient Re-evaluated prior to induction Oxygen Delivery Method: Circle system utilized Preoxygenation: Pre-oxygenation with 100% oxygen Induction Type: IV induction Ventilation: Mask ventilation without difficulty Laryngoscope Size: Miller and 2 Grade View: Grade II Tube type: Oral Tube size: 7.5 mm Number of attempts: 2 Airway Equipment and Method: Stylet and Oral airway Placement Confirmation: ETT inserted through vocal cords under direct vision,  positive ETCO2 and breath sounds checked- equal and bilateral Secured at: 23 cm Tube secured with: Tape Dental Injury: Teeth and Oropharynx as per pre-operative assessment

## 2020-04-14 NOTE — Transfer of Care (Signed)
Immediate Anesthesia Transfer of Care Note  Patient: Roy Patel  Procedure(s) Performed: ARTHROSCOPY SHOULDER with debridement (Right Shoulder) IRRIGATION AND DEBRIDEMENT SHOULDER (Right Shoulder)  Patient Location: PACU  Anesthesia Type:General  Level of Consciousness: awake, alert  and drowsy  Airway & Oxygen Therapy: Patient Spontanous Breathing  Post-op Assessment: Report given to RN and Post -op Vital signs reviewed and stable,   Post vital signs: Reviewed and stable  Last Vitals:  Vitals Value Taken Time  BP 124/87   Temp//    Pulse 90   Resp 14   SpO2 100     Last Pain:  Vitals:   04/14/20 1455  TempSrc:   PainSc: Asleep      Patients Stated Pain Goal: 3 (04/14/20 1223)  Complications: No complications documented.

## 2020-04-14 NOTE — Op Note (Signed)
04/14/2020  4:50 PM  PATIENT:  Roy Patel    PRE-OPERATIVE DIAGNOSIS: Right shoulder postoperative infection  POST-OPERATIVE DIAGNOSIS:  Same  PROCEDURE: Right shoulder arthroscopy with extensive debridement, irrigation and debridement, removal of a total of 2 arthroscopic anchors  SURGEON:  Eulas Post, MD  PHYSICIAN ASSISTANT: Janine Ores, PA-C, present and scrubbed throughout the case, critical for completion in a timely fashion, and for retraction, instrumentation, and closure.  ANESTHESIA:   General with regional block  PREOPERATIVE INDICATIONS:  Roy Patel is a  59 y.o. male who presented 4 weeks after right shoulder arthroscopy with rotator cuff repair with significant soft tissue swelling and an aspiration performed in the office that demonstrated purulent material from the subacromial space.  The risks benefits and alternatives were discussed with the patient preoperatively including but not limited to the risks of infection, bleeding, nerve injury, cardiopulmonary complications, the need for revision surgery, among others, and the patient was willing to proceed.  We also discussed the potential need for revision irrigation and debridement, failure to ability to successfully maintain his rotator cuff repair, lung others.  ESTIMATED BLOOD LOSS: Minimal  OPERATIVE IMPLANTS: I removed a total of 2 Arthrex bio composite 4.75 mm swivel locks from the posterior medial and posterior lateral anchors, and I did find one of the eyelets from the posterior medial anchor.  The anterior medial and anterior lateral anchors were still engaged within the bone.  I removed all the fiber tape that was loose, and there was really only one limb remaining on the anterior limb of the rotator cuff repair.  OPERATIVE FINDINGS: The rotator cuff is actually still in the appropriate position, but there was significant purulence within the subacromial space with a lot of fibrinous material.  The  joint itself actually did not look that bad, there was a little bit of turbid fluid, I sent cultures from both the joint as well as the subacromial space separately, and these were sent before the antibiotics were initiated.  Operative cultures: Right shoulder joint, and the right subacromial space sent separately, with an indication to grow for 2 weeks for Propionibacterium acnes.  OPERATIVE PROCEDURE: The patient was brought to the operating room and placed in the supine position.  General anesthesia was administered.  The right upper extremity was prepped and draped in usual sterile fashion.  He was in a beachchair position.  I introduced a posterior cannula through his previous portals, and used a spinal needle from the anterior portal to aspirate fluid before initiating any irrigation.  This was taken from the joint.  I irrigated copiously throughout the joint, used the arthroscopic shaver to remove any inflamed synovium, and irrigated a total of 6 L of fluid through the joint.  I then did the same thing for the subacromial space, introducing the cannula into the subacromial space and then taking cultures, and then used the lateral portal to debride the fibrinous material.  There was a anchor floating loose within the subacromial space which I removed, and ultimately found the location to be from the posterior medial anchor site, and I also found the eyelet, both of which were removed.  Loose strands of suture were cut, and removed, the remaining 2 anchors anteriorly remained in the bone and were not loose.  The rotator cuff itself actually remained in reasonably good position, although the posterior aspect of the repair was no longer functional.  I used arthroscopic scissors as well as the knife to cut the  fiber tape loose from the anchor position.  Debridement type: Excisional Debridement  Side: right  Body Location: Glenohumeral joint and subacromial space  Tools used for debridement:  arthroscopic shaver  Pre-debridement Wound size (cm):   Not applicable, there was no wound, I used 3 total portals that were each 1 cm long through the skin.  Post-debridement Wound size (cm):   As above  Debridement depth beyond dead/damaged tissue down to healthy viable tissue: yes  Tissue layer involved: skin, subcutaneous tissue, muscle / fascia, bone  Nature of tissue removed: Slough, Pension scheme manager and Purulence  Irrigation volume: 15 L     Irrigation fluid type: Normal Saline   The portals were left open to drain, and dressed with sterile gauze.  He was awakened and returned to the PACU in stable and satisfactory condition.  There were no complications and he tolerated the procedure well.

## 2020-04-14 NOTE — Anesthesia Postprocedure Evaluation (Signed)
Anesthesia Post Note  Patient: Roy Patel  Procedure(s) Performed: ARTHROSCOPY SHOULDER with debridement (Right Shoulder) IRRIGATION AND DEBRIDEMENT SHOULDER (Right Shoulder)     Patient location during evaluation: PACU Anesthesia Type: General Level of consciousness: sedated and patient cooperative Pain management: pain level controlled Vital Signs Assessment: post-procedure vital signs reviewed and stable Respiratory status: spontaneous breathing Cardiovascular status: stable Anesthetic complications: no   No complications documented.  Last Vitals:  Vitals:   04/14/20 1815 04/14/20 1834  BP: (!) 164/89 (!) 171/98  Pulse: 80 85  Resp: 17 18  Temp: (!) 36.1 C 36.9 C  SpO2: 98% 99%    Last Pain:  Vitals:   04/14/20 1834  TempSrc: Oral  PainSc:                  Lewie Loron

## 2020-04-14 NOTE — H&P (Signed)
PREOPERATIVE H&P  Chief Complaint: Right shoulder pain  HPI: Roy Patel is a 59 y.o. male who had a right shoulder rotator cuff repair approximately 4 weeks ago.  He was doing well at the 2-week Valerie, but then a couple of days ago developed significant swelling, increased pain, and felt like something had changed in the right shoulder.  He presented to the office yesterday, and had what felt like a fullness to the subacromial space.  I aspirated his shoulder and obtained gross purulence.  He has not been on antibiotics.  I recommended urgent surgical intervention with debridement.  Patient denies systemic symptoms but has had significant sciatic pain over the last 24 hours.  He denies fevers or chills.   Past Medical History:  Diagnosis Date  . Cluster headache   . G6PD deficiency    ?  Marland Kitchen Hypertension   . Pleurisy    Past Surgical History:  Procedure Laterality Date  . ANTERIOR CRUCIATE LIGAMENT REPAIR  2009   MCL removed ([facility])  . EXTENSOR TENDON OF FOREARM / WRIST REPAIR  ~1970   Left  . MENISCUS REPAIR  1997   [place], Right knee  . MENISCUS REPAIR  2000   [place], Left knee   Social History   Socioeconomic History  . Marital status: Single    Spouse name: Not on file  . Number of children: 1  . Years of education: Not on file  . Highest education level: Not on file  Occupational History  . Occupation: Health visitor at [employer]  . Smoking status: Current Every Day Smoker    Packs/day: 1.00    Types: Cigarettes  . Smokeless tobacco: Never Used  Substance and Sexual Activity  . Alcohol use: Yes    Alcohol/week: 2.0 standard drinks    Types: 2 Cans of beer per week    Comment: occasional  . Drug use: Never  . Sexual activity: Not on file  Other Topics Concern  . Not on file  Social History Narrative   Married:  2nd   Son in [place]   Step daughter here.   Social Determinants of Health   Financial Resource Strain: Not on file  Food  Insecurity: Not on file  Transportation Needs: Not on file  Physical Activity: Not on file  Stress: Not on file  Social Connections: Not on file   Family History  Problem Relation Age of Onset  . Cancer Mother        Lung  . Diabetes Father   . Heart disease Father        CHF  . Colon polyps Brother   . Colon polyps Brother   . Hypertension Neg Hx    Allergies  Allergen Reactions  . Augmentin [Amoxicillin-Pot Clavulanate] Nausea And Vomiting    "throws up violently"  . Chlorthalidone Other (See Comments)    Felt bad/lethargic  . Sulfa Antibiotics Other (See Comments)    Jaundice   Prior to Admission medications   Medication Sig Start Date End Date Taking? Authorizing Provider  acetaminophen (TYLENOL) 325 MG tablet Take 1,000 mg by mouth every 6 (six) hours as needed for moderate pain.   Yes [provider]  HYDROcodone-acetaminophen (NORCO) 10-325 MG tablet Take 1 tablet by mouth as needed for pain. 03/30/20  Yes [provider]  meloxicam (MOBIC) 15 MG tablet Take 15 mg by mouth as needed for pain. 04/08/20  Yes [provider]  PROAIR HFA 108 (90  Base) MCG/ACT inhaler TAKE 2 PUFFS BY MOUTH EVERY 6 HOURS AS NEEDED FOR WHEEZE OR SHORTNESS OF BREATH Patient taking differently: Inhale 1 puff into the lungs every 4 (four) hours as needed for wheezing or shortness of breath. 06/11/18  Yes [name], MD  SUMAtriptan (IMITREX) 6 MG/0.5ML SOLN injection Inject 6 mg into the skin as needed (for cluster headaches).   Yes [provider]     Positive ROS: All other systems have been reviewed and were otherwise negative with the exception of those mentioned in the HPI and as above.  Physical Exam: General: Alert, no acute distress, he has fairly poor dentition. Cardiovascular: No pedal edema Respiratory: No cyanosis, no use of accessory musculature GI: No organomegaly, abdomen is soft and non-tender Skin: Surgical wounds are closed, minimal  erythema. Neurologic: Sensation intact distally Psychiatric: Patient is competent for consent with normal mood and affect Lymphatic: No axillary or cervical lymphadenopathy  MUSCULOSKELETAL: He has a fullness to the right shoulder, no purulent drainage.  Assessment: Septic right shoulder, status post arthroscopic rotator cuff repair 4 weeks ago.   Plan: Plan for Procedure(s): ARTHROSCOPY SHOULDER with debridement IRRIGATION AND DEBRIDEMENT SHOULDER  The risks benefits and alternatives were discussed with the patient including but not limited to the risks of nonoperative treatment, versus surgical intervention including infection, bleeding, nerve injury,  blood clots, cardiopulmonary complications, morbidity, mortality, among others, and they were willing to proceed.  We discussed the risks for progression to osteomyelitis, the inability to achieve rotator cuff healing, the potential for multiple repeat operations, the need for a PICC line and antibiotics, and he is willing to proceed.  Anticipated LOS equal to or greater than 2 midnights due to - Age 7 and older with one or more of the following:  - Obesity  - Expected need for hospital services (PT, OT, Nursing) required for safe  discharge  - Anticipated need for postoperative skilled nursing care or inpatient rehab   OR   - Unanticipated findings during/Post Surgery: Infected knee joint or operative site  - Patient is a high risk of re-admission due to: None     Eulas Post, MD Cell [phone]   04/14/2020 2:49 PM

## 2020-04-14 NOTE — Progress Notes (Signed)
Report received from PACU. Patient arrived to room 22 6N. Alert and oriented x4. Wife at bedside. Drsg to right should dry and intact. Sling to right shoulder and arm in place. Patient oriented to room and assisted to restroom. Patient ordered dinner tray. Patient denies pain at this time.

## 2020-04-15 ENCOUNTER — Encounter (HOSPITAL_COMMUNITY): Payer: Self-pay | Admitting: Orthopedic Surgery

## 2020-04-15 ENCOUNTER — Inpatient Hospital Stay (HOSPITAL_COMMUNITY): Payer: 59

## 2020-04-15 DIAGNOSIS — M5430 Sciatica, unspecified side: Secondary | ICD-10-CM | POA: Diagnosis present

## 2020-04-15 DIAGNOSIS — Z8249 Family history of ischemic heart disease and other diseases of the circulatory system: Secondary | ICD-10-CM | POA: Diagnosis not present

## 2020-04-15 DIAGNOSIS — M25511 Pain in right shoulder: Secondary | ICD-10-CM | POA: Diagnosis present

## 2020-04-15 DIAGNOSIS — F17200 Nicotine dependence, unspecified, uncomplicated: Secondary | ICD-10-CM | POA: Diagnosis not present

## 2020-04-15 DIAGNOSIS — I10 Essential (primary) hypertension: Secondary | ICD-10-CM | POA: Diagnosis present

## 2020-04-15 DIAGNOSIS — T8469XA Infection and inflammatory reaction due to internal fixation device of other site, initial encounter: Secondary | ICD-10-CM | POA: Diagnosis present

## 2020-04-15 DIAGNOSIS — M009 Pyogenic arthritis, unspecified: Secondary | ICD-10-CM | POA: Diagnosis present

## 2020-04-15 DIAGNOSIS — Z880 Allergy status to penicillin: Secondary | ICD-10-CM | POA: Diagnosis not present

## 2020-04-15 DIAGNOSIS — M25411 Effusion, right shoulder: Secondary | ICD-10-CM | POA: Diagnosis not present

## 2020-04-15 DIAGNOSIS — Z882 Allergy status to sulfonamides status: Secondary | ICD-10-CM | POA: Diagnosis not present

## 2020-04-15 DIAGNOSIS — Z23 Encounter for immunization: Secondary | ICD-10-CM | POA: Diagnosis not present

## 2020-04-15 DIAGNOSIS — Z833 Family history of diabetes mellitus: Secondary | ICD-10-CM | POA: Diagnosis not present

## 2020-04-15 DIAGNOSIS — Z20822 Contact with and (suspected) exposure to covid-19: Secondary | ICD-10-CM | POA: Diagnosis present

## 2020-04-15 DIAGNOSIS — F17210 Nicotine dependence, cigarettes, uncomplicated: Secondary | ICD-10-CM | POA: Diagnosis present

## 2020-04-15 DIAGNOSIS — I160 Hypertensive urgency: Secondary | ICD-10-CM | POA: Diagnosis present

## 2020-04-15 DIAGNOSIS — Y792 Prosthetic and other implants, materials and accessory orthopedic devices associated with adverse incidents: Secondary | ICD-10-CM | POA: Diagnosis present

## 2020-04-15 DIAGNOSIS — T8142XA Infection following a procedure, deep incisional surgical site, initial encounter: Secondary | ICD-10-CM

## 2020-04-15 LAB — HEPATITIS PANEL, ACUTE
HCV Ab: REACTIVE — AB
Hep A IgM: NONREACTIVE
Hep B C IgM: NONREACTIVE
Hepatitis B Surface Ag: NONREACTIVE

## 2020-04-15 LAB — BASIC METABOLIC PANEL
Anion gap: 11 (ref 5–15)
BUN: 19 mg/dL (ref 6–20)
CO2: 22 mmol/L (ref 22–32)
Calcium: 8.9 mg/dL (ref 8.9–10.3)
Chloride: 101 mmol/L (ref 98–111)
Creatinine, Ser: 0.95 mg/dL (ref 0.61–1.24)
GFR, Estimated: >60 mL/min (ref >60)
Glucose, Bld: 167 mg/dL — ABNORMAL HIGH (ref 70–99)
Potassium: 4.4 mmol/L (ref 3.5–5.1)
Sodium: 134 mmol/L — ABNORMAL LOW (ref 135–145)

## 2020-04-15 LAB — CBC
HCT: 37.4 % — ABNORMAL LOW (ref 39.0–52.0)
Hemoglobin: 12.6 g/dL — ABNORMAL LOW (ref 13.0–17.0)
MCH: 33.7 pg (ref 26.0–34.0)
MCHC: 33.7 g/dL (ref 30.0–36.0)
MCV: 100 fL (ref 80.0–100.0)
Platelets: 252 10*3/uL (ref 150–400)
RBC: 3.74 MIL/uL — ABNORMAL LOW (ref 4.22–5.81)
RDW: 12.2 % (ref 11.5–15.5)
WBC: 6.2 10*3/uL (ref 4.0–10.5)
nRBC: 0 % (ref 0.0–0.2)

## 2020-04-15 LAB — HIV ANTIBODY (ROUTINE TESTING W REFLEX): HIV Screen 4th Generation wRfx: NONREACTIVE

## 2020-04-15 MED ORDER — LABETALOL HCL 5 MG/ML IV SOLN
10.0000 mg | Freq: Once | INTRAVENOUS | Status: AC
  Administered 2020-04-15: 10 mg via INTRAVENOUS
  Filled 2020-04-15: qty 4

## 2020-04-15 MED ORDER — SODIUM CHLORIDE 0.9% FLUSH
10.0000 mL | INTRAVENOUS | Status: DC | PRN
  Administered 2020-04-16: 10 mL

## 2020-04-15 MED ORDER — GABAPENTIN 100 MG PO CAPS
100.0000 mg | ORAL_CAPSULE | Freq: Every day | ORAL | Status: DC
  Administered 2020-04-15: 100 mg via ORAL
  Filled 2020-04-15: qty 1

## 2020-04-15 MED ORDER — HYDRALAZINE HCL 10 MG PO TABS
10.0000 mg | ORAL_TABLET | Freq: Three times a day (TID) | ORAL | Status: DC | PRN
  Administered 2020-04-15: 10 mg via ORAL
  Filled 2020-04-15 (×2): qty 1

## 2020-04-15 MED ORDER — LABETALOL HCL 5 MG/ML IV SOLN: 10.0000 mg | INTRAVENOUS | Status: DC | PRN

## 2020-04-15 MED ORDER — INFLUENZA VAC SPLIT QUAD 0.5 ML IM SUSY
0.5000 mL | PREFILLED_SYRINGE | INTRAMUSCULAR | Status: AC
  Administered 2020-04-16: 0.5 mL via INTRAMUSCULAR
  Filled 2020-04-15: qty 0.5

## 2020-04-15 MED ORDER — SODIUM CHLORIDE 0.9 % IV SOLN
INTRAVENOUS | Status: DC | PRN
  Administered 2020-04-15: 250 mL via INTRAVENOUS

## 2020-04-15 MED ORDER — SODIUM CHLORIDE 0.9% FLUSH
10.0000 mL | Freq: Two times a day (BID) | INTRAVENOUS | Status: DC
  Administered 2020-04-15 – 2020-04-16 (×2): 10 mL

## 2020-04-15 MED ORDER — LABETALOL HCL 5 MG/ML IV SOLN
10.0000 mg | Freq: Once | INTRAVENOUS | Status: DC
  Filled 2020-04-15: qty 4

## 2020-04-15 MED ORDER — DIAZEPAM 2 MG PO TABS
2.0000 mg | ORAL_TABLET | Freq: Four times a day (QID) | ORAL | Status: DC | PRN
  Administered 2020-04-15: 2 mg via ORAL
  Filled 2020-04-15: qty 1

## 2020-04-15 MED ORDER — PNEUMOCOCCAL VAC POLYVALENT 25 MCG/0.5ML IJ INJ
0.5000 mL | INJECTION | INTRAMUSCULAR | Status: AC
  Administered 2020-04-16: 0.5 mL via INTRAMUSCULAR
  Filled 2020-04-15: qty 0.5

## 2020-04-15 MED ORDER — MUPIROCIN 2 % EX OINT
1.0000 "application " | TOPICAL_OINTMENT | Freq: Two times a day (BID) | CUTANEOUS | Status: DC
  Administered 2020-04-15 – 2020-04-16 (×3): 1 via NASAL
  Filled 2020-04-15 (×3): qty 22

## 2020-04-15 MED ORDER — CEFTRIAXONE IV (FOR PTA / DISCHARGE USE ONLY): 2.0000 g | INTRAVENOUS | 0 refills | Status: DC

## 2020-04-15 MED ORDER — HYDRALAZINE HCL 10 MG PO TABS: 10.0000 mg | ORAL_TABLET | Freq: Three times a day (TID) | ORAL | Status: DC | PRN

## 2020-04-15 MED ORDER — OXYCODONE HCL 5 MG PO TABS
5.0000 mg | ORAL_TABLET | ORAL | Status: DC | PRN
  Administered 2020-04-15 – 2020-04-16 (×4): 5 mg via ORAL
  Filled 2020-04-15 (×4): qty 1

## 2020-04-15 MED ORDER — DIAZEPAM 5 MG PO TABS
5.0000 mg | ORAL_TABLET | Freq: Four times a day (QID) | ORAL | Status: DC | PRN
  Administered 2020-04-15: 5 mg via ORAL
  Filled 2020-04-15: qty 1

## 2020-04-15 MED ORDER — CHLORHEXIDINE GLUCONATE CLOTH 2 % EX PADS
6.0000 | MEDICATED_PAD | Freq: Every day | CUTANEOUS | Status: DC
  Administered 2020-04-15 – 2020-04-16 (×2): 6 via TOPICAL

## 2020-04-15 MED ORDER — SODIUM CHLORIDE 0.9 % IV SOLN
2.0000 g | INTRAVENOUS | Status: DC
  Administered 2020-04-15 – 2020-04-16 (×2): 2 g via INTRAVENOUS
  Filled 2020-04-15 (×2): qty 20

## 2020-04-15 MED ORDER — HYDROCODONE-ACETAMINOPHEN 7.5-325 MG PO TABS: 1.0000 | ORAL_TABLET | ORAL | Status: DC | PRN

## 2020-04-15 NOTE — Evaluation (Signed)
Occupational Therapy Evaluation Patient Details Name: Roy Patel MRN: [id number] DOB: 07/31/1960 Today's Date: 04/15/2020    History of Present Illness Roy Patel is a 59 y.o. male with hx of R rotator cuff surgery, 3 tendon repairs and bone spurs removed 03-17-20 (outpt). By 12-25, pain and swelling resolved.  He then developed "masssive pain" and swelling. He came to Dr Shelba Flake office 12-29 and had joint aspiration which was felt to be consistent with infection.  He had I & D 12-30.   Clinical Impression   Pt presents with impaired ROM, strength and function of R UE s/p RTC repair 4 weeks ago. Pt with I & D of R shoulder 04/14/20. Pt NWB of R UE and per verbal order from PA 04/15/20, no ROM allowed for R UE (except elbow, wrist and hand ROM). Pt requires min A for ADLs/selfcare and will have assist at home from his wife. Reviewed compensatory bathing and dressing techniques, sling donning/doffing. Pt verbalized understanding. All education completed and no further acute OT services are indicated at this time    Follow Up Recommendations  Follow surgeon's recommendation for DC plan and follow-up therapies    Equipment Recommendations  None recommended by OT    Recommendations for Other Services       Precautions / Restrictions Precautions Precautions: Shoulder Type of Shoulder Precautions: No ROM of R shoulder, ROM only for elbow, wrist, hand Shoulder Interventions: Shoulder sling/immobilizer;Off for dressing/bathing/exercises Required Braces or Orthoses: Sling Splint/Cast: R UE Restrictions Weight Bearing Restrictions: Yes RUE Weight Bearing: Non weight bearing      Mobility Bed Mobility Overal bed mobility: Independent                  Transfers Overall transfer level: Independent Equipment used: None                  Balance Overall balance assessment: No apparent balance deficits (not formally assessed)                                          ADL either performed or assessed with clinical judgement   ADL Overall ADL's : Needs assistance/impaired Eating/Feeding: Independent;Set up   Grooming: Wash/dry hands;Wash/dry face;Modified independent   Upper Body Bathing: Minimal assistance   Lower Body Bathing: Minimal assistance   Upper Body Dressing : Minimal assistance   Lower Body Dressing: Minimal assistance   Toilet Transfer: Independent   Toileting- Clothing Manipulation and Hygiene: Minimal assistance   Tub/ Shower Transfer: Independent   Functional mobility during ADLs: Independent General ADL Comments: reviewed compensatory bathing and dressing techniques, sling donning/doffing. Pt verbalized understanding     Vision Baseline Vision/History: No visual deficits Patient Visual Report: No change from baseline       Perception     Praxis      Pertinent Vitals/Pain Pain Assessment: No/denies pain     Hand Dominance Right   Extremity/Trunk Assessment Upper Extremity Assessment Upper Extremity Assessment: RUE deficits/detail RUE Deficits / Details: NWB, no ROM of R shoulder RUE: Unable to fully assess due to immobilization       Cervical / Trunk Assessment Cervical / Trunk Assessment: Normal   Communication Communication Communication: No difficulties   Cognition Arousal/Alertness: Awake/alert Behavior During Therapy: WFL for tasks assessed/performed Overall Cognitive Status: Within Functional Limits for tasks assessed  General Comments       Exercises     Shoulder Instructions      Home Living Family/patient expects to be discharged to:: Private residence Living Arrangements: Spouse/significant other Available Help at Discharge: Family Type of Home: House       Home Layout: One level     Bathroom Shower/Tub: Producer, television/film/video: Standard     Home Equipment: None          Prior  Functioning/Environment Level of Independence: Independent        Comments: some assist from his wife for ADLs since RTC repair 4 weeks ago, priro to that pt was Ind        OT Problem List: Impaired UE functional use;Decreased range of motion;Decreased activity tolerance      OT Treatment/Interventions:      OT Goals(Current goals can be found in the care plan section) Acute Rehab OT Goals Patient Stated Goal: go home OT Goal Formulation: With patient  OT Frequency:     Barriers to D/C:            Co-evaluation              AM-PAC OT "6 Clicks" Daily Activity     Outcome Measure Help from another person eating meals?: None Help from another person taking care of personal grooming?: None Help from another person toileting, which includes using toliet, bedpan, or urinal?: A Little Help from another person bathing (including washing, rinsing, drying)?: A Little Help from another person to put on and taking off regular upper body clothing?: A Little Help from another person to put on and taking off regular lower body clothing?: A Little 6 Click Score: 20   End of Session Equipment Utilized During Treatment: Other (comment) (R UE sling)  Activity Tolerance: Patient tolerated treatment well Patient left: in bed;Other (comment) (sitting EOB)  OT Visit Diagnosis: Pain                Time: 1011-1030 OT Time Calculation (min): 19 min Charges:  OT General Charges $OT Visit: 1 Visit OT Evaluation $OT Eval Low Complexity: 1 Low    Galen Manila 04/15/2020, 12:00 PM

## 2020-04-15 NOTE — TOC Initial Note (Addendum)
Transition of Care [facility]) - Initial/Assessment Note    Patient Details  Name: Roy Patel MRN: [id number] Date of Birth: [birth year]  [facility] CM/SW Contact:    Kingsley Plan, RN Phone Number: 04/15/2020, 10:57 AM  Clinical Narrative:                  Confirmed face sheet information.   Discussed going home on IV ABX.   Patient will have a nurse but the nurse will not be there every time a dose is due. Pam with [facility] will provide teaching to him and his wife prior to discharge.   Patient voiced understanding. His wife is a dialysis nurse.   NCM calling home health agencies to set up a [facility]   Kandee Keen with [facility] Expected Discharge Plan: Home w Home Health Services     Patient Goals and CMS Choice Patient states their goals for this hospitalization and ongoing recovery are:: to return to home CMS Medicare.gov Compare Post Acute Care list provided to:: Patient Choice offered to / list presented to : Patient  Expected Discharge Plan and Services Expected Discharge Plan: Home w Home Health Services   Discharge Planning Services: CM Consult Post Acute Care Choice: Home Health Living arrangements for the past 2 months: Single Family Home                 DME Arranged: N/A         HH Arranged: RN          Prior Living Arrangements/Services Living arrangements for the past 2 months: Single Family Home Lives with:: Spouse Patient language and need for interpreter reviewed:: Yes Do you feel safe going back to the place where you live?: Yes      Need for Family Participation in Patient Care: Yes (Comment) Care giver support system in place?: Yes (comment)   Criminal Activity/Legal Involvement Pertinent to Current Situation/Hospitalization: No - Comment as needed  Activities of Daily Living Home Assistive Devices/Equipment: Eyeglasses ADL Screening (condition at time of admission) Patient's cognitive  ability adequate to safely complete daily activities?: Yes Is the patient deaf or have difficulty hearing?: No Does the patient have difficulty seeing, even when wearing glasses/contacts?: No Does the patient have difficulty concentrating, remembering, or making decisions?: No Patient able to express need for assistance with ADLs?: Yes Does the patient have difficulty dressing or bathing?: No Independently performs ADLs?: Yes (appropriate for developmental age) Does the patient have difficulty walking or climbing stairs?: No Weakness of Legs: None Weakness of Arms/Hands: None  Permission Sought/Granted   Permission granted to share information with : No              Emotional Assessment Appearance:: Appears stated age Attitude/Demeanor/Rapport: Engaged Affect (typically observed): Accepting Orientation: : Oriented to Self,Oriented to Place,Oriented to  Time,Oriented to Situation Alcohol / Substance Use: Not Applicable Psych Involvement: No (comment)  Admission diagnosis:  Post-operative infection [T81.40XA] Patient Active Problem List   Diagnosis Date Noted  . Post-operative infection 04/14/2020  . Pain and swelling of right shoulder 04/13/2020  . Acute bronchitis 05/24/2017  . Preventative health care 09/29/2015  . G6PD deficiency 02/05/2015  . Essential hypertension 02/05/2015  . Hypertension   . Right shoulder pain 09/06/2010  . UNSPECIFIED VIRAL HEPATITIS C W/O HEPATIC COMA 06/18/2008  . CIGARETTE SMOKER 05/27/2008  . CLUSTER HEADACHE 05/27/2008  . CARPAL TUNNEL SYNDROME 05/27/2008   PCP:  [name], MD  Pharmacy:   [facility] - [place], [place] - [id number], [address] [place] [address] Phone: [phone] Fax: [phone]  [id number] - [place], [place] - [address] [place] [address] Phone: [phone] Fax: [phone]     Social Determinants of Health (SDOH)  Interventions    Readmission Risk Interventions No flowsheet data found.

## 2020-04-15 NOTE — Progress Notes (Addendum)
Subjective: 1 Day Post-Op s/p Procedure(s): ARTHROSCOPY SHOULDER with debridement IRRIGATION AND DEBRIDEMENT SHOULDER   No pain this am. Tingling in right hand, has not regained full feeling in arm or hand yet. Denies nausea, vomiting, chest pain, shortness of breath, abdominal pain,.  Objective:  PE: VITALS:   Vitals:   04/14/20 1815 04/14/20 1834 04/14/20 2054 04/15/20 0500  BP: (!) 164/89 (!) 171/98 (!) 171/91 (!) 160/86  Pulse: 80 85 (!) 103 87  Resp: 17 18 18 18   Temp: (!) 97 F (36.1 C) 98.4 F (36.9 C) 98.6 F (37 C) 98.4 F (36.9 C)  TempSrc:  Oral Oral Oral  SpO2: 98% 99% 98% 98%  Weight:      Height:       General: alert, sitting on side of bed MSK: RUE in sling. Dressings removed with moderate watery drainage. Full ROM in wrist, hand, and fingers. Distal sensation intact. Hand warm and well perfused   LABS  Results for orders placed or performed during the hospital encounter of 04/14/20 (from the past 24 hour(s))  SARS Coronavirus 2 by RT PCR (hospital order, performed in [facility] hospital lab) Nasopharyngeal Nasopharyngeal Swab     Status: None   Collection Time: 04/14/20 12:02 PM   Specimen: Nasopharyngeal Swab  Result Value Ref Range   SARS Coronavirus 2 NEGATIVE NEGATIVE  CBC     Status: None   Collection Time: 04/14/20 12:11 PM  Result Value Ref Range   WBC 9.9 4.0 - 10.5 K/uL   RBC 4.43 4.22 - 5.81 MIL/uL   Hemoglobin 14.9 13.0 - 17.0 g/dL   HCT 04/16/20 40.9 - 81.1 %   MCV 99.1 80.0 - 100.0 fL   MCH 33.6 26.0 - 34.0 pg   MCHC 33.9 30.0 - 36.0 g/dL   RDW 91.4 78.2 - 95.6 %   Platelets 325 150 - 400 K/uL   nRBC 0.0 0.0 - 0.2 %  Comprehensive metabolic panel     Status: Abnormal   Collection Time: 04/14/20 12:11 PM  Result Value Ref Range   Sodium 136 135 - 145 mmol/L   Potassium 4.1 3.5 - 5.1 mmol/L   Chloride 102 98 - 111 mmol/L   CO2 22 22 - 32 mmol/L   Glucose, Bld 105 (H) 70 - 99 mg/dL   BUN 12 6 - 20 mg/dL   Creatinine, Ser 04/16/20  0.61 - 1.24 mg/dL   Calcium 9.9 8.9 - 0.86 mg/dL   Total Protein 7.9 6.5 - 8.1 g/dL   Albumin 3.3 (L) 3.5 - 5.0 g/dL   AST 57.8 (H) 15 - 41 U/L   ALT 122 (H) 0 - 44 U/L   Alkaline Phosphatase 105 38 - 126 U/L   Total Bilirubin 0.8 0.3 - 1.2 mg/dL   GFR, Estimated 469 >62 mL/min   Anion gap 12 5 - 15  Surgical pcr screen     Status: Abnormal   Collection Time: 04/14/20 12:50 PM   Specimen: Nasal Mucosa; Nasal Swab  Result Value Ref Range   MRSA, PCR NEGATIVE NEGATIVE   Staphylococcus aureus POSITIVE (A) NEGATIVE  Aerobic/Anaerobic Culture (surgical/deep wound)     Status: None (Preliminary result)   Collection Time: 04/14/20  3:51 PM   Specimen: PATH Other; Body Fluid  Result Value Ref Range   Specimen Description JOINT FLUID RIGHT SHOULDER    Special Requests HOLD P. ACNES 2WKS    Gram Stain      MODERATE WBC PRESENT, PREDOMINANTLY PMN  NO ORGANISMS SEEN Performed at [facility], [address] [place], [place] [address]    Culture PENDING    Report Status PENDING   Aerobic/Anaerobic Culture (surgical/deep wound)     Status: None (Preliminary result)   Collection Time: 04/14/20  4:02 PM   Specimen: PATH Other; Body Fluid  Result Value Ref Range   Specimen Description JOINT FLUID RIGHT SHOULDER    Special Requests SUBACROMIAL    Gram Stain      ABUNDANT WBC PRESENT,BOTH PMN AND MONONUCLEAR NO ORGANISMS SEEN Performed at [facility], [address] [place], [place] [address]    Culture PENDING    Report Status PENDING   Basic metabolic panel     Status: Abnormal   Collection Time: 04/15/20  1:14 AM  Result Value Ref Range   Sodium 134 (L) 135 - 145 mmol/L   Potassium 4.4 3.5 - 5.1 mmol/L   Chloride 101 98 - 111 mmol/L   CO2 22 22 - 32 mmol/L   Glucose, Bld 167 (H) 70 - 99 mg/dL   BUN 19 6 - 20 mg/dL   Creatinine, Ser 3.82 0.61 - 1.24 mg/dL   Calcium 8.9 8.9 - 50.5 mg/dL   GFR, Estimated >39 >76 mL/min   Anion gap 11 5 - 15  CBC     Status: Abnormal    Collection Time: 04/15/20  1:14 AM  Result Value Ref Range   WBC 6.2 4.0 - 10.5 K/uL   RBC 3.74 (L) 4.22 - 5.81 MIL/uL   Hemoglobin 12.6 (L) 13.0 - 17.0 g/dL   HCT 73.4 (L) 19.3 - 79.0 %   MCV 100.0 80.0 - 100.0 fL   MCH 33.7 26.0 - 34.0 pg   MCHC 33.7 30.0 - 36.0 g/dL   RDW 24.0 97.3 - 53.2 %   Platelets 252 150 - 400 K/uL   nRBC 0.0 0.0 - 0.2 %    No results found.  Assessment/Plan:  Post operative right shoulder infection 4 weeks after right shoulder rotator cuff repair: - 1 day post op - remains afebrile - normal WBC  Weightbearing: NWB RUE, OT eval placed, okay for AROM through hand, wrist, and elbow. Patient has ongoing sciatica pain, encouraged patient to get up and walk to decrease sciatica pain.  Insicional and dressing care: daily dressing changes ID: patient on broad spectrum vanc and zosyn, intraoperative and preoperative aspiration have so far showed no culture results, will continue to keep an eye on these. Will consult ID as patient will likely need PICC line placed in hospital Pain control: continue current regimen Follow - up plan: Follow up with Dr. Dion Saucier 1 week after discharge Dispo: pending culture results  Contact information:   Weekdays 8-5 Janine Ores, PA-C [phone] A fter hours and holidays please check [email or web address] for group call information for [facility]  Roy Patel 04/15/2020, 9:16 AM

## 2020-04-15 NOTE — Progress Notes (Signed)
Patient continues to have high BP after valium and oral hydralazine given. Called hospitalist Dr. Precious Reel who recommended IV labetalol 10 mg with continued regular BP and pulse checks and if tolerating this with slow BP decreases, changing to oral 100mg  BID. Will add in labetolol order. According to nursing patient is not complaining of chest pain or other change in symptoms. No new increased pain. Continue BP and pulse checks.

## 2020-04-15 NOTE — Consult Note (Addendum)
[facility]    Date of Admission:  04/14/2020   Total days of antibiotics: 0 vanco/zosyn               Reason for Consult: Septic arthritis    Referring Provider: Dion Saucier   Assessment: Septic arthritis Tobacco use  Plan: 1. Will change his anbx to ceftriaxone/vanco to slightly narrow 2. Will check hepatitis and hiv panels 3. Await cx results 4. Will place pic 5. He is aware he needs to quit smoking and I have encouraged him to do so.   Comment- P acnes is the classic bacteria associated with shoulder infections however this does not preclude other infections. His wife is HD nurse, and he is willing to have home anbx, PIC line.     Thank you so much for this interesting consult,  Principal Problem:   Pain and swelling of right shoulder Active Problems:   Post-operative infection   . acetaminophen  500 mg Oral Q6H  . docusate sodium  100 mg Oral BID  . [START ON 04/16/2020] influenza vac split quadrivalent PF  0.5 mL Intramuscular Tomorrow-1000  . mupirocin ointment  1 application Nasal BID  . [START ON 04/16/2020] pneumococcal 23 valent vaccine  0.5 mL Intramuscular Tomorrow-1000  . senna  1 tablet Oral BID    HPI: Roy Patel is a 59 y.o. male with hx of R rotator cuff surgery, 3 tendon repairs and bone spurs removed 03-17-20 (outpt). By 12-25, pain and swelling resolved. He then developed "masssive pain" and swelling. He came to Dr Shelba Flake office 12-29 and had joint aspiration which was felt to be consistent with infection. He had I & D 12-30.    Review of Systems: Review of Systems  Constitutional: Negative for chills and fever.  Respiratory: Positive for cough. Negative for shortness of breath.   Gastrointestinal: Negative for constipation and diarrhea.  Genitourinary: Positive for frequency. Negative for dysuria.  Musculoskeletal: Positive for joint pain.    Past Medical History:  Diagnosis Date  . Cluster headache    . G6PD deficiency    ?  Marland Kitchen Hypertension   . Pleurisy     Social History   Tobacco Use  . Smoking status: Current Every Day Smoker    Packs/day: 1.00    Types: Cigarettes  . Smokeless tobacco: Never Used  Vaping Use  . Vaping Use: Never used  Substance Use Topics  . Alcohol use: Yes    Alcohol/week: 2.0 standard drinks    Types: 2 Cans of beer per week    Comment: occasional  . Drug use: Never    Family History  Problem Relation Age of Onset  . Cancer Mother        Lung  . Diabetes Father   . Heart disease Father        CHF  . Colon polyps Brother   . Colon polyps Brother   . Hypertension Neg Hx      Medications:  Scheduled: . acetaminophen  500 mg Oral Q6H  . docusate sodium  100 mg Oral BID  . [START ON 04/16/2020] influenza vac split quadrivalent PF  0.5 mL Intramuscular Tomorrow-1000  . mupirocin ointment  1 application Nasal BID  . [START ON 04/16/2020] pneumococcal 23 valent vaccine  0.5 mL Intramuscular Tomorrow-1000  . senna  1 tablet Oral BID    Abtx:  Anti-infectives (From admission, onward)   Start     Dose/Rate  Route Frequency Ordered Stop   04/15/20 0400  vancomycin (VANCOCIN) IVPB 1000 mg/200 mL premix        1,000 mg 200 mL/hr over 60 Minutes Intravenous Every 12 hours 04/14/20 1843     04/15/20 0000  piperacillin-tazobactam (ZOSYN) IVPB 3.375 g        3.375 g 12.5 mL/hr over 240 Minutes Intravenous Every 8 hours 04/14/20 1843     04/14/20 1600  piperacillin-tazobactam (ZOSYN) IVPB 3.375 g        3.375 g 12.5 mL/hr over 240 Minutes Intravenous To Surgery 04/14/20 1550 04/14/20 1600   04/14/20 1600  vancomycin (VANCOCIN) IVPB 1000 mg/200 mL premix        1,000 mg 200 mL/hr over 60 Minutes Intravenous To Surgery 04/14/20 1550 04/14/20 1700        OBJECTIVE: Blood pressure (!) 160/86, pulse 87, temperature 98.4 F (36.9 C), temperature source Oral, resp. rate 18, height 5\' 9"  (1.753 m), weight 70.3 kg, SpO2 98 %.  Physical  Exam Constitutional:      General: He is not in acute distress.    Appearance: Normal appearance. He is normal weight. He is not ill-appearing.  HENT:     Mouth/Throat:     Mouth: Mucous membranes are moist.     Pharynx: Oropharynx is clear.  Eyes:     Extraocular Movements: Extraocular movements intact.     Pupils: Pupils are equal, round, and reactive to light.  Cardiovascular:     Rate and Rhythm: Normal rate and regular rhythm.  Pulmonary:     Effort: Pulmonary effort is normal.     Breath sounds: Normal breath sounds.  Abdominal:     General: Bowel sounds are normal. There is no distension.     Palpations: Abdomen is soft.     Tenderness: There is no abdominal tenderness.  Musculoskeletal:       Arms:     Cervical back: Normal range of motion and neck supple.     Right lower leg: No edema.     Left lower leg: No edema.  Neurological:     Mental Status: He is alert.  Psychiatric:        Mood and Affect: Mood normal.     Lab Results Results for orders placed or performed during the hospital encounter of 04/14/20 (from the past 48 hour(s))  SARS Coronavirus 2 by RT PCR (hospital order, performed in [facility] hospital lab) Nasopharyngeal Nasopharyngeal Swab     Status: None   Collection Time: 04/14/20 12:02 PM   Specimen: Nasopharyngeal Swab  Result Value Ref Range   SARS Coronavirus 2 NEGATIVE NEGATIVE    Comment: (NOTE) SARS-CoV-2 target nucleic acids are NOT DETECTED.  The SARS-CoV-2 RNA is generally detectable in upper and lower respiratory specimens during the acute phase of infection. The lowest concentration of SARS-CoV-2 viral copies this assay can detect is 250 copies / mL. A negative result does not preclude SARS-CoV-2 infection and should not be used as the sole basis for treatment or other patient management decisions.  A negative result may occur with improper specimen collection / handling, submission of specimen other than nasopharyngeal swab,  presence of viral mutation(s) within the areas targeted by this assay, and inadequate number of viral copies (<250 copies / mL). A negative result must be combined with clinical observations, patient history, and epidemiological information.  Fact Sheet for Patients:   BoilerBrush.com.cyhttps://www.fda.gov/media/136312/download  Fact Sheet for Healthcare Providers: https://pope.com/https://www.fda.gov/media/136313/download  This test is not yet approved  or  cleared by the [place] and has been authorized for detection and/or diagnosis of SARS-CoV-2 by FDA under an Emergency Use Authorization (EUA).  This EUA will remain in effect (meaning this test can be used) for the duration of the COVID-19 declaration under Section 564(b)(1) of the Act, 21 U.S.C. section 360bbb-3(b)(1), unless the authorization is terminated or revoked sooner.  Performed at [facility], [address], [place], [place] [address]   CBC     Status: None   Collection Time: 04/14/20 12:11 PM  Result Value Ref Range   WBC 9.9 4.0 - 10.5 K/uL   RBC 4.43 4.22 - 5.81 MIL/uL   Hemoglobin 14.9 13.0 - 17.0 g/dL   HCT 58.0 99.8 - 33.8 %   MCV 99.1 80.0 - 100.0 fL   MCH 33.6 26.0 - 34.0 pg   MCHC 33.9 30.0 - 36.0 g/dL   RDW 25.0 53.9 - 76.7 %   Platelets 325 150 - 400 K/uL   nRBC 0.0 0.0 - 0.2 %    Comment: Performed at [facility], [address], [place], [place] [address]  Comprehensive metabolic panel     Status: Abnormal   Collection Time: 04/14/20 12:11 PM  Result Value Ref Range   Sodium 136 135 - 145 mmol/L   Potassium 4.1 3.5 - 5.1 mmol/L   Chloride 102 98 - 111 mmol/L   CO2 22 22 - 32 mmol/L   Glucose, Bld 105 (H) 70 - 99 mg/dL    Comment: Glucose reference range applies only to samples taken after fasting for at least 8 hours.   BUN 12 6 - 20 mg/dL   Creatinine, Ser 7.90 0.61 - 1.24 mg/dL   Calcium 9.9 8.9 - 24.0 mg/dL   Total Protein 7.9 6.5 - 8.1 g/dL   Albumin 3.3 (L) 3.5 - 5.0 g/dL   AST 973 (H) 15 - 41  U/L   ALT 122 (H) 0 - 44 U/L   Alkaline Phosphatase 105 38 - 126 U/L   Total Bilirubin 0.8 0.3 - 1.2 mg/dL   GFR, Estimated >53 >29 mL/min    Comment: (NOTE) Calculated using the CKD-EPI Creatinine Equation (2021)    Anion gap 12 5 - 15    Comment: Performed at [facility], [address] [place], [place] [address]  Surgical pcr screen     Status: Abnormal   Collection Time: 04/14/20 12:50 PM   Specimen: Nasal Mucosa; Nasal Swab  Result Value Ref Range   MRSA, PCR NEGATIVE NEGATIVE   Staphylococcus aureus POSITIVE (A) NEGATIVE    Comment: (NOTE) The Xpert SA Assay (FDA approved for NASAL specimens in patients 17 years of age and older), is one component of a comprehensive surveillance program. It is not intended to diagnose infection nor to guide or monitor treatment. Performed at [facility], [address], [place], [place] [address]   Aerobic/Anaerobic Culture (surgical/deep wound)     Status: None (Preliminary result)   Collection Time: 04/14/20  3:51 PM   Specimen: PATH Other; Body Fluid  Result Value Ref Range   Specimen Description JOINT FLUID RIGHT SHOULDER    Special Requests HOLD P. ACNES 2WKS    Gram Stain      MODERATE WBC PRESENT, PREDOMINANTLY PMN NO ORGANISMS SEEN Performed at [facility], [address], [place], [place] [address]    Culture PENDING    Report Status PENDING   Aerobic/Anaerobic Culture (surgical/deep  wound)     Status: None (Preliminary result)   Collection Time: 04/14/20  4:02 PM   Specimen: PATH Other; Body Fluid  Result Value Ref Range   Specimen Description JOINT FLUID RIGHT SHOULDER    Special Requests SUBACROMIAL    Gram Stain      ABUNDANT WBC PRESENT,BOTH PMN AND MONONUCLEAR NO ORGANISMS SEEN    Culture      NO GROWTH < 24 HOURS Performed at [facility], [address] [place], [place] [address]    Report Status PENDING   Basic metabolic panel     Status: Abnormal   Collection Time: 04/15/20   1:14 AM  Result Value Ref Range   Sodium 134 (L) 135 - 145 mmol/L   Potassium 4.4 3.5 - 5.1 mmol/L   Chloride 101 98 - 111 mmol/L   CO2 22 22 - 32 mmol/L   Glucose, Bld 167 (H) 70 - 99 mg/dL    Comment: Glucose reference range applies only to samples taken after fasting for at least 8 hours.   BUN 19 6 - 20 mg/dL   Creatinine, Ser 6.21 0.61 - 1.24 mg/dL   Calcium 8.9 8.9 - 30.8 mg/dL   GFR, Estimated >65 >78 mL/min    Comment: (NOTE) Calculated using the CKD-EPI Creatinine Equation (2021)    Anion gap 11 5 - 15    Comment: Performed at [facility], [address], [place], [place] [address]  CBC     Status: Abnormal   Collection Time: 04/15/20  1:14 AM  Result Value Ref Range   WBC 6.2 4.0 - 10.5 K/uL   RBC 3.74 (L) 4.22 - 5.81 MIL/uL   Hemoglobin 12.6 (L) 13.0 - 17.0 g/dL   HCT 95.2 (L) 84.1 - 32.4 %   MCV 100.0 80.0 - 100.0 fL   MCH 33.7 26.0 - 34.0 pg   MCHC 33.7 30.0 - 36.0 g/dL   RDW 40.1 02.7 - 25.3 %   Platelets 252 150 - 400 K/uL   nRBC 0.0 0.0 - 0.2 %    Comment: Performed at [facility], [address] [place], [place] [address]      Component Value Date/Time   SDES JOINT FLUID RIGHT SHOULDER 04/14/2020 1602   SPECREQUEST SUBACROMIAL 04/14/2020 1602   CULT  04/14/2020 1602    NO GROWTH < 24 HOURS Performed at [facility], [address], [place], [place] [address]    REPTSTATUS PENDING 04/14/2020 1602   No results found. Recent Results (from the past 240 hour(s))  SARS Coronavirus 2 by RT PCR (hospital order, performed in [facility] hospital lab) Nasopharyngeal Nasopharyngeal Swab     Status: None   Collection Time: 04/14/20 12:02 PM   Specimen: Nasopharyngeal Swab  Result Value Ref Range Status   SARS Coronavirus 2 NEGATIVE NEGATIVE Final    Comment: (NOTE) SARS-CoV-2 target nucleic acids are NOT DETECTED.  The SARS-CoV-2 RNA is generally detectable in upper and lower respiratory specimens during the acute phase of infection. The  lowest concentration of SARS-CoV-2 viral copies this assay can detect is 250 copies / mL. A negative result does not preclude SARS-CoV-2 infection and should not be used as the sole basis for treatment or other patient management decisions.  A negative result may occur with improper specimen collection / handling, submission of specimen other than nasopharyngeal swab, presence of viral mutation(s) within the areas targeted by this assay, and inadequate number of viral copies (<  250 copies / mL). A negative result must be combined with clinical observations, patient history, and epidemiological information.  Fact Sheet for Patients:   BoilerBrush.com.cy  Fact Sheet for Healthcare Providers: https://pope.com/  This test is not yet approved or  cleared by the [place] FDA and has been authorized for detection and/or diagnosis of SARS-CoV-2 by FDA under an Emergency Use Authorization (EUA).  This EUA will remain in effect (meaning this test can be used) for the duration of the COVID-19 declaration under Section 564(b)(1) of the Act, 21 U.S.C. section 360bbb-3(b)(1), unless the authorization is terminated or revoked sooner.  Performed at [facility], [address] [place], [place] [address]   Surgical pcr screen     Status: Abnormal   Collection Time: 04/14/20 12:50 PM   Specimen: Nasal Mucosa; Nasal Swab  Result Value Ref Range Status   MRSA, PCR NEGATIVE NEGATIVE Final   Staphylococcus aureus POSITIVE (A) NEGATIVE Final    Comment: (NOTE) The Xpert SA Assay (FDA approved for NASAL specimens in patients 39 years of age and older), is one component of a comprehensive surveillance program. It is not intended to diagnose infection nor to guide or monitor treatment. Performed at [facility], [address] [place], [place] [address]   Aerobic/Anaerobic Culture (surgical/deep wound)     Status: None (Preliminary  result)   Collection Time: 04/14/20  3:51 PM   Specimen: PATH Other; Body Fluid  Result Value Ref Range Status   Specimen Description JOINT FLUID RIGHT SHOULDER  Final   Special Requests HOLD P. ACNES 2WKS  Final   Gram Stain   Final    MODERATE WBC PRESENT, PREDOMINANTLY PMN NO ORGANISMS SEEN Performed at [facility] [place] [facility], [address] [place], [place] [address]    Culture PENDING  Incomplete   Report Status PENDING  Incomplete  Aerobic/Anaerobic Culture (surgical/deep wound)     Status: None (Preliminary result)   Collection Time: 04/14/20  4:02 PM   Specimen: PATH Other; Body Fluid  Result Value Ref Range Status   Specimen Description JOINT FLUID RIGHT SHOULDER  Final   Special Requests SUBACROMIAL  Final   Gram Stain   Final    ABUNDANT WBC PRESENT,BOTH PMN AND MONONUCLEAR NO ORGANISMS SEEN    Culture   Final    NO GROWTH < 24 HOURS Performed at [facility], [address], [place], [place] [address]    Report Status PENDING  Incomplete    Microbiology: Recent Results (from the past 240 hour(s))  SARS Coronavirus 2 by RT PCR (hospital order, performed in [facility] hospital lab) Nasopharyngeal Nasopharyngeal Swab     Status: None   Collection Time: 04/14/20 12:02 PM   Specimen: Nasopharyngeal Swab  Result Value Ref Range Status   SARS Coronavirus 2 NEGATIVE NEGATIVE Final    Comment: (NOTE) SARS-CoV-2 target nucleic acids are NOT DETECTED.  The SARS-CoV-2 RNA is generally detectable in upper and lower respiratory specimens during the acute phase of infection. The lowest concentration of SARS-CoV-2 viral copies this assay can detect is 250 copies / mL. A negative result does not preclude SARS-CoV-2 infection and should not be used as the sole basis for treatment or other patient management decisions.  A negative result may occur with improper specimen collection / handling, submission of specimen other than nasopharyngeal swab, presence of viral  mutation(s) within the areas targeted by this assay, and inadequate number of viral copies (<250 copies / mL).  A negative result must be combined with clinical observations, patient history, and epidemiological information.  Fact Sheet for Patients:   BoilerBrush.com.cy  Fact Sheet for Healthcare Providers: https://pope.com/  This test is not yet approved or  cleared by the [place] FDA and has been authorized for detection and/or diagnosis of SARS-CoV-2 by FDA under an Emergency Use Authorization (EUA).  This EUA will remain in effect (meaning this test can be used) for the duration of the COVID-19 declaration under Section 564(b)(1) of the Act, 21 U.S.C. section 360bbb-3(b)(1), unless the authorization is terminated or revoked sooner.  Performed at [facility], [address] [place], [place] [address]   Surgical pcr screen     Status: Abnormal   Collection Time: 04/14/20 12:50 PM   Specimen: Nasal Mucosa; Nasal Swab  Result Value Ref Range Status   MRSA, PCR NEGATIVE NEGATIVE Final   Staphylococcus aureus POSITIVE (A) NEGATIVE Final    Comment: (NOTE) The Xpert SA Assay (FDA approved for NASAL specimens in patients 42 years of age and older), is one component of a comprehensive surveillance program. It is not intended to diagnose infection nor to guide or monitor treatment. Performed at [facility], [address], [place], [place] [address]   Aerobic/Anaerobic Culture (surgical/deep wound)     Status: None (Preliminary result)   Collection Time: 04/14/20  3:51 PM   Specimen: PATH Other; Body Fluid  Result Value Ref Range Status   Specimen Description JOINT FLUID RIGHT SHOULDER  Final   Special Requests HOLD P. ACNES 2WKS  Final   Gram Stain   Final    MODERATE WBC PRESENT, PREDOMINANTLY PMN NO ORGANISMS SEEN Performed at [facility], [address], [place], [place] [address]    Culture  PENDING  Incomplete   Report Status PENDING  Incomplete  Aerobic/Anaerobic Culture (surgical/deep wound)     Status: None (Preliminary result)   Collection Time: 04/14/20  4:02 PM   Specimen: PATH Other; Body Fluid  Result Value Ref Range Status   Specimen Description JOINT FLUID RIGHT SHOULDER  Final   Special Requests SUBACROMIAL  Final   Gram Stain   Final    ABUNDANT WBC PRESENT,BOTH PMN AND MONONUCLEAR NO ORGANISMS SEEN    Culture   Final    NO GROWTH < 24 HOURS Performed at [facility], [address] [place], [place] [address]    Report Status PENDING  Incomplete    Radiographs and labs were personally reviewed by me.   Johny Sax, MD [facility] Infectious Disease [facility] [phone] 04/15/2020, 11:13 AM

## 2020-04-15 NOTE — Progress Notes (Addendum)
Called regarding persistent high blood pressure, with new systolic BP over 161. Unable to take BP on arms due to shoulder surgery on RUE and PICC line on LUE. BP's taken with 2 cuff on legs.  Patient tearful on exam regarding needing to stay in hospital longer than expected and over prospect regarding his shoulder. States that he has been on BP med in the past but was taken off of them due to consistent normal BP readings. Called wife who confirms anxiety regarding hospital stay as well as that she has recently been taking patient's BP over the last few weeks and BP's have remained below 140 systolic during these readings. Patient does also note some increased coughing since admission. He is a known long term smoker. Denies chest pain, new shortness of breath. States he is having some tingling in his right leg and feels like his sciatica is acting up due to sitting so much. States his shoulder is not in pain and hydrocodone is controlling his shoulder pain currently.   On Exam: Alert and oriented x 3. Not diaphoretic. Normal cardiac rate and rhythm. Normal respiratory effort. Sitting comfortably in chair.  hypertensive urgency: - patient remains asymptomatic with no signs of end organ failure - will order EKG - will order Chest x-ray for new cough - WBC normal this morning, patient remains afebrile, not tachycardic - will add on diazepam to target anxiety patient is feeling regarding his shoulder - will also add on gabapentin this evening for his sciatica pain

## 2020-04-15 NOTE — Progress Notes (Signed)
PHARMACY CONSULT NOTE FOR:  OUTPATIENT  PARENTERAL ANTIBIOTIC THERAPY (OPAT)  Indication: septic arthritis Regimen: Ceftriaxone 2g Q24h End date: 05/27/2020  IV antibiotic discharge orders are pended. To discharging provider:  please sign these orders via discharge navigator,  Select New Orders & click on the button choice - Manage This Unsigned Work.     Thank you for allowing pharmacy to be a part of this patient's care.  Gerrit Halls, PharmD Clinical Pharmacist  04/15/2020, 11:53 AM

## 2020-04-15 NOTE — Progress Notes (Signed)
PT Cancellation Note  Patient Details Name: STILLMAN BUENGER MRN: [id number] DOB: 29-Sep-1960   Cancelled Treatment:    Reason Eval/Treat Not Completed: PT screened, no needs identified, will sign off - per OT, pt with no PT needs, PT to sign off. Please reconsult as needed.   Marye Round, PT Acute Rehabilitation Services Pager [phone]  Office [phone]    Truddie Coco 04/15/2020, 10:34 AM

## 2020-04-15 NOTE — Progress Notes (Signed)
   04/15/20 1628  Assess: MEWS Score  BP (!) 210/100  Pulse Rate 70  Resp 16  Level of Consciousness Alert  Assess: MEWS Score  MEWS Temp 0  MEWS Systolic 2  MEWS Pulse 0  MEWS RR 0  MEWS LOC 0  MEWS Score 2  MEWS Score Color Yellow  Assess: if the MEWS score is Yellow or Red  Were vital signs taken at a resting state? Yes  Focused Assessment Change from prior assessment (see assessment flowsheet)  Early Detection of Sepsis Score *See Row Information* Low  MEWS guidelines implemented *See Row Information* Yes  Treat  Pain Scale 0-10  Pain Score 0  Take Vital Signs  Increase Vital Sign Frequency  Yellow: Q 2hr X 2 then Q 4hr X 2, if remains yellow, continue Q 4hrs  Escalate  MEWS: Escalate Yellow: discuss with charge nurse/RN and consider discussing with provider and RRT  Notify: Charge Nurse/RN  Name of Charge Nurse/RN Notified Aurea Graff RN  Date Charge Nurse/RN Notified 04/15/20  Time Charge Nurse/RN Notified 1628  Notify: Provider  Provider Name/Title Manson Passey, PA  Date Provider Notified 04/15/20  Time Provider Notified [phone]  Notification Type Face-to-face  Notification Reason Change in status  Response See new orders  Date of Provider Response 04/15/20  Time of Provider Response [phone]

## 2020-04-15 NOTE — Significant Event (Signed)
Rapid Response Event Note   Reason for Call :  Asked by PA to evaluate pt for htn. Pt's BP was 193/111, labetolol was given, and repeat BP was 220-113.  Pt has LUE PICC and RUE in sling d/t shoulder surgery so BPs are being taken in legs.   Initial Focused Assessment:  Pt alert and oriented, walking around room with IV pump. He denies SOB/chest pain/headache. I helped him lay back in the bed. His lungs are clear t/o, ABD nontender/soft. Skin is warm and dry. Pulses are +2 in B radials and B DPs. His R arm is immobilized in sling.   BPs taken in multiple sites:  R lower leg: 185/126 L lower leg: 215/102 L lower arm: 164/79(PICC line arm used with permission from IVT for a one time cuff correlation.) R lower arm: 194/91 and 172/83 R upper arm: 172/87  R arm used with permission of Dr. Dion Saucier and PA Thermon Leyland.   Interventions:  No RRT interventions  Plan of Care:  Per PA Blaine, please use R lower arm for BP readings. SBP< 180 is acceptable at this time. Pt instructed to notify RN if he starts to have SOB/chest pain/headache. Pt also to notify RN if pain equals or is above 4/10. Robaxin, valium also available PRN. Please call RRT if further assistance needed.    Event Summary:   MD Notified: Thermon Leyland, PA Call [phone] Arrival Time:2140 End [facility]:6808  Terrilyn Saver, RN

## 2020-04-16 ENCOUNTER — Observation Stay: Payer: Self-pay

## 2020-04-16 DIAGNOSIS — M25411 Effusion, right shoulder: Secondary | ICD-10-CM

## 2020-04-16 DIAGNOSIS — T8142XA Infection following a procedure, deep incisional surgical site, initial encounter: Secondary | ICD-10-CM | POA: Diagnosis not present

## 2020-04-16 DIAGNOSIS — M25511 Pain in right shoulder: Secondary | ICD-10-CM | POA: Diagnosis not present

## 2020-04-16 LAB — BASIC METABOLIC PANEL
Anion gap: 6 (ref 5–15)
BUN: 11 mg/dL (ref 6–20)
CO2: 25 mmol/L (ref 22–32)
Calcium: 8.7 mg/dL — ABNORMAL LOW (ref 8.9–10.3)
Chloride: 106 mmol/L (ref 98–111)
Creatinine, Ser: 0.76 mg/dL (ref 0.61–1.24)
GFR, Estimated: >60 mL/min (ref >60)
Glucose, Bld: 141 mg/dL — ABNORMAL HIGH (ref 70–99)
Potassium: 3.7 mmol/L (ref 3.5–5.1)
Sodium: 137 mmol/L (ref 135–145)

## 2020-04-16 LAB — CBC
HCT: 34.2 % — ABNORMAL LOW (ref 39.0–52.0)
Hemoglobin: 11.9 g/dL — ABNORMAL LOW (ref 13.0–17.0)
MCH: 35 pg — ABNORMAL HIGH (ref 26.0–34.0)
MCHC: 34.8 g/dL (ref 30.0–36.0)
MCV: 100.6 fL — ABNORMAL HIGH (ref 80.0–100.0)
Platelets: 257 10*3/uL (ref 150–400)
RBC: 3.4 MIL/uL — ABNORMAL LOW (ref 4.22–5.81)
RDW: 12.5 % (ref 11.5–15.5)
WBC: 12.8 10*3/uL — ABNORMAL HIGH (ref 4.0–10.5)
nRBC: 0 % (ref 0.0–0.2)

## 2020-04-16 MED ORDER — VANCOMYCIN IV (FOR PTA / DISCHARGE USE ONLY): 1000.0000 mg | Freq: Two times a day (BID) | INTRAVENOUS | 0 refills | Status: DC

## 2020-04-16 MED ORDER — OXYCODONE HCL 5 MG PO TABS: 5.0000 mg | ORAL_TABLET | ORAL | Status: DC | PRN

## 2020-04-16 MED ORDER — DIAZEPAM 2 MG PO TABS: 2.0000 mg | ORAL_TABLET | Freq: Two times a day (BID) | ORAL | 0 refills | Status: AC | PRN

## 2020-04-16 MED ORDER — OXYCODONE HCL 5 MG PO TABS: 5.0000 mg | ORAL_TABLET | ORAL | 0 refills | Status: AC | PRN

## 2020-04-16 MED ORDER — OXYCODONE HCL 5 MG PO TABS
10.0000 mg | ORAL_TABLET | ORAL | Status: DC | PRN
  Administered 2020-04-16: 10 mg via ORAL
  Filled 2020-04-16: qty 2

## 2020-04-16 MED ORDER — HEPARIN SOD (PORK) LOCK FLUSH 100 UNIT/ML IV SOLN
250.0000 [IU] | INTRAVENOUS | Status: AC | PRN
  Administered 2020-04-16: 250 [IU]
  Filled 2020-04-16: qty 2.5

## 2020-04-16 MED ORDER — BACLOFEN 10 MG PO TABS: 10.0000 mg | ORAL_TABLET | Freq: Three times a day (TID) | ORAL | 0 refills | Status: AC

## 2020-04-16 MED ORDER — OXYCODONE HCL 5 MG PO TABS
5.0000 mg | ORAL_TABLET | ORAL | Status: DC | PRN
Start: 2020-04-16 — End: 2020-04-16

## 2020-04-16 MED ORDER — ONDANSETRON HCL 4 MG PO TABS: 4.0000 mg | ORAL_TABLET | Freq: Three times a day (TID) | ORAL | 0 refills | Status: DC | PRN

## 2020-04-16 NOTE — Progress Notes (Signed)
Subjective: 2 Days Post-Op s/p Procedure(s): ARTHROSCOPY SHOULDER with debridement IRRIGATION AND DEBRIDEMENT SHOULDER  Patient is alert, oriented, standing up and walking in room. States pain is a 6/10. Denies headaches, chest pain, shortness of breath, abdominal pain, nausea, vomiting, sweating, parasthesias.   Objective:  PE: VITALS:   Vitals:   04/15/20 2208 04/15/20 2211 04/16/20 0144 04/16/20 0507  BP: (!) 172/83 (!) 172/87 (!) 169/70 (!) 170/82  Pulse:   70 72  Resp:   17 17  Temp:   98.3 F (36.8 C) 98.1 F (36.7 C)  TempSrc:   Oral Oral  SpO2:   99% 99%  Weight:      Height:       General: alert, oriented, standing up and walking in room.  Resp: no use of accessory musculature MSK: RUE in sling. Dressings removed with moderate watery drainage. Full ROM in wrist, hand, and fingers. Distal sensation intact. Hand warm and well perfused. Good pulses.    LABS  Results for orders placed or performed during the hospital encounter of 04/14/20 (from the past 24 hour(s))  HIV Antibody (routine testing w rflx)     Status: None   Collection Time: 04/15/20 12:43 PM  Result Value Ref Range   HIV Screen 4th Generation wRfx Non Reactive Non Reactive  Hepatitis panel, acute     Status: Abnormal   Collection Time: 04/15/20 12:43 PM  Result Value Ref Range   Hepatitis B Surface Ag NON REACTIVE NON REACTIVE   HCV Ab Reactive (A) NON REACTIVE   Hep A IgM NON REACTIVE NON REACTIVE   Hep B C IgM NON REACTIVE NON REACTIVE  CBC     Status: Abnormal   Collection Time: 04/16/20  4:06 AM  Result Value Ref Range   WBC 12.8 (H) 4.0 - 10.5 K/uL   RBC 3.40 (L) 4.22 - 5.81 MIL/uL   Hemoglobin 11.9 (L) 13.0 - 17.0 g/dL   HCT 27.0 (L) 62.3 - 76.2 %   MCV 100.6 (H) 80.0 - 100.0 fL   MCH 35.0 (H) 26.0 - 34.0 pg   MCHC 34.8 30.0 - 36.0 g/dL   RDW 83.1 51.7 - 61.6 %   Platelets 257 150 - 400 K/uL   nRBC 0.0 0.0 - 0.2 %  Basic metabolic panel     Status: Abnormal   Collection Time:  04/16/20  4:06 AM  Result Value Ref Range   Sodium 137 135 - 145 mmol/L   Potassium 3.7 3.5 - 5.1 mmol/L   Chloride 106 98 - 111 mmol/L   CO2 25 22 - 32 mmol/L   Glucose, Bld 141 (H) 70 - 99 mg/dL   BUN 11 6 - 20 mg/dL   Creatinine, Ser 0.73 0.61 - 1.24 mg/dL   Calcium 8.7 (L) 8.9 - 10.3 mg/dL   GFR, Estimated >71 >06 mL/min   Anion gap 6 5 - 15    DG Chest 2 View  Result Date: 04/15/2020 CLINICAL DATA:  Cough, hypertension EXAM: CHEST - 2 VIEW COMPARISON:  12/03/2016 FINDINGS: Frontal and lateral views of the chest demonstrate a stable cardiac silhouette. Chronic scarring at the left base. No airspace disease, effusion, or pneumothorax. Left-sided PICC tip projects over the superior vena cava. There is subcutaneous gas in the right supraclavicular region overlying the lateral margin of the right clavicle. By report, the patient has had recent right shoulder surgery. IMPRESSION: 1. No acute intrathoracic process. 2. Subcutaneous gas at the right shoulder, please correlate with  recent procedure or surgery. Electronically Signed   By: Sharlet Salina M.D.   On: 04/15/2020 17:16   [place] EKG SITE RITE  Result Date: 04/15/2020 If Site Rite image not attached, placement could not be confirmed due to current cardiac rhythm.   Assessment/Plan:  Post operative right shoulder infection 4 weeks after right shoulder rotator cuff repair: 2 Days Post-Op s/p Procedure(s): ARTHROSCOPY SHOULDER with debridement IRRIGATION AND DEBRIDEMENT SHOULDER - remains afebrile -WBC increased to 12.8 Weightbearing: NWB RUE, OT eval placed, okay for AROM through hand, wrist, and elbow.  Insicional and dressing care: daily dressing changes, will plan to change this afternoon prior to discharge ID: PICC placed, ok with discharge and changing antibiotics as outpatient based on culture results. No culture results yet.  Pain control: increased regimen to allow for 10mg  oxycodone if needed q 4 hours Follow - up plan:  Follow up with Dr. 1 week after discharge  Hypertension: - only take Bps at right upper arm or right forearm - patient continues to have elevated blood pressures but remaining below 180 systolic and 100 diastolic - will increase pain control, encouraged patient to rest - prn valium on board, patient tearful yesterday and some today regarding shoulder infection, encouraged patient to use as needed  Cough: - long term smoker, educated on smoking cessation - chest x-ray normal  Dispo: likely d/c this afternoon if able to complete picc line teaching for family today  Contact information:   Weekdays 8-5 12-18-2005, Janine Ores [place] A fter hours and holidays please check [email or web address] for group call information for [facility]  [phone] 04/16/2020, 10:15 AM

## 2020-04-16 NOTE — Discharge Summary (Addendum)
Discharge Summary  Patient ID: Roy Patel MRN: [id number] DOB/AGE: [birth year] 60 y.o.  Admit date: 04/14/2020 Discharge date: 04/16/2020  Admission Diagnoses:  Pain and swelling of right shoulder  Discharge Diagnoses:  Principal Problem:   Pain and swelling of right shoulder Active Problems:   Post-operative infection   Past Medical History:  Diagnosis Date  . Cluster headache   . G6PD deficiency    ?  Marland Kitchen Hypertension   . Pleurisy     Surgeries: Procedure(s): ARTHROSCOPY SHOULDER with debridement IRRIGATION AND DEBRIDEMENT SHOULDER on 04/14/2020   Consultants (if any):   Discharged Condition: Improved  Hospital Course: Roy Patel is an 60 y.o. male who was admitted 04/14/2020 with a diagnosis of Pain and swelling of right shoulder and went to the operating room on 04/14/2020 and underwent the above named procedures.    He was given perioperative antibiotics:  Anti-infectives (From admission, onward)   Start     Dose/Rate Route Frequency Ordered Stop   04/16/20 0000  vancomycin IVPB        1,000 mg Intravenous Every 12 hours 04/16/20 1141 05/27/20 2359   04/15/20 1700  cefTRIAXone (ROCEPHIN) 2 g in sodium chloride 0.9 % 100 mL IVPB        2 g 200 mL/hr over 30 Minutes Intravenous Every 24 hours 04/15/20 1136     04/15/20 0400  vancomycin (VANCOCIN) IVPB 1000 mg/200 mL premix        1,000 mg 200 mL/hr over 60 Minutes Intravenous Every 12 hours 04/14/20 1843     04/15/20 0000  piperacillin-tazobactam (ZOSYN) IVPB 3.375 g  Status:  Discontinued        3.375 g 12.5 mL/hr over 240 Minutes Intravenous Every 8 hours 04/14/20 1843 04/15/20 1136   04/15/20 0000  cefTRIAXone (ROCEPHIN) IVPB        2 g Intravenous Every 24 hours 04/15/20 1234 05/27/20 2359   04/14/20 1600  piperacillin-tazobactam (ZOSYN) IVPB 3.375 g        3.375 g 12.5 mL/hr over 240 Minutes Intravenous To Surgery 04/14/20 1550 04/14/20 1600   04/14/20 1600  vancomycin (VANCOCIN) IVPB 1000 mg/200 mL  premix        1,000 mg 200 mL/hr over 60 Minutes Intravenous To Surgery 04/14/20 1550 04/14/20 1700    .  He was given sequential compression devices and early ambulation for DVT prophylaxis.  He benefited maximally from the hospital stay, there was was incident of hypertensive urgency which resolved with IV labetalol.   Recent vital signs:  Vitals:   04/16/20 0144 04/16/20 0507  BP: (!) 169/70 (!) 170/82  Pulse: 70 72  Resp: 17 17  Temp: 98.3 F (36.8 C) 98.1 F (36.7 C)  SpO2: 99% 99%    Recent laboratory studies:  Lab Results  Component Value Date   HGB 11.9 (L) 04/16/2020   HGB 12.6 (L) 04/15/2020   HGB 14.9 04/14/2020   Lab Results  Component Value Date   WBC 12.8 (H) 04/16/2020   PLT 257 04/16/2020   Lab Results  Component Value Date   INR 1.18 02/05/2015   Lab Results  Component Value Date   NA 137 04/16/2020   K 3.7 04/16/2020   CL 106 04/16/2020   CO2 25 04/16/2020   BUN 11 04/16/2020   CREATININE 0.76 04/16/2020   GLUCOSE 141 (H) 04/16/2020    Discharge Medications:   Allergies as of 04/16/2020      Reactions   Augmentin [amoxicillin-pot Clavulanate] Nausea And  Vomiting   "throws up violently"   Chlorthalidone Other (See Comments)   Felt bad/lethargic   Sulfa Antibiotics Other (See Comments)   Jaundice      Medication List    STOP taking these medications   HYDROcodone-acetaminophen 10-325 MG tablet Commonly known as: NORCO   meloxicam 15 MG tablet Commonly known as: MOBIC     TAKE these medications   acetaminophen 325 MG tablet Commonly known as: TYLENOL Take 1,000 mg by mouth every 6 (six) hours as needed for moderate pain.   baclofen 10 MG tablet Commonly known as: LIORESAL Take 1 tablet (10 mg total) by mouth 3 (three) times daily. As needed for muscle spasm   cefTRIAXone  IVPB Commonly known as: ROCEPHIN Inject 2 g into the vein daily. Indication: Septic arthritis First Dose: No Last Day of Therapy:  05/27/2020 Labs -  Once weekly:  CBC/D and BMP, Labs - Every other week:  ESR and CRP Method of administration: IV Push Method of administration may be changed at the discretion of home infusion pharmacist based upon assessment of the patient and/or caregiver's ability to self-administer the medication ordered.   diazepam 2 MG tablet Commonly known as: Valium Take 1 tablet (2 mg total) by mouth every 12 (twelve) hours as needed for anxiety.   ondansetron 4 MG tablet Commonly known as: Zofran Take 1 tablet (4 mg total) by mouth every 8 (eight) hours as needed for nausea or vomiting.   oxyCODONE 5 MG immediate release tablet Commonly known as: Roxicodone Take 1-2 tablets (5-10 mg total) by mouth every 4 (four) hours as needed for up to 7 days for severe pain.   ProAir HFA 108 (90 Base) MCG/ACT inhaler Generic drug: albuterol TAKE 2 PUFFS BY MOUTH EVERY 6 HOURS AS NEEDED FOR WHEEZE OR SHORTNESS OF BREATH What changed: See the new instructions.   SUMAtriptan 6 MG/0.5ML Soln injection Commonly known as: IMITREX Inject 6 mg into the skin as needed (for cluster headaches).   vancomycin  IVPB Inject 1,000 mg into the vein every 12 (twelve) hours. Indication:  septic arthritis First Dose: Yes Last Day of Therapy:  05/27/2020 Labs - $Remo"Sunday/Monday:  CBC/D, BMP, and vancomycin trough. Labs - Thursday:  BMP and vancomycin trough Labs - Every other week:  ESR and CRP Method of administration:Elastomeric Method of administration may be changed at the discretion of the patient and/or caregiver's ability to self-administer the medication ordered.            Discharge Care Instructions  (From admission, onward)         Start     Ordered   04/16/20 0000  Change dressing on IV access line weekly and PRN  (Home infusion instructions - [facility] )        04/16/20 1141   04/15/20 0000  Change dressing on IV access line weekly and PRN  (Home infusion instructions - [facility] )         12"rNtGW$ /31/21 1234          Diagnostic Studies: DG Chest 2 View  Result Date: 04/15/2020 CLINICAL DATA:  Cough, hypertension EXAM: CHEST - 2 VIEW COMPARISON:  12/03/2016 FINDINGS: Frontal and lateral views of the chest demonstrate a stable cardiac silhouette. Chronic scarring at the left base. No airspace disease, effusion, or pneumothorax. Left-sided PICC tip projects over the superior vena cava. There is subcutaneous gas in the right supraclavicular region overlying the lateral margin of the right clavicle. By report, the  patient has had recent right shoulder surgery. IMPRESSION: 1. No acute intrathoracic process. 2. Subcutaneous gas at the right shoulder, please correlate with recent procedure or surgery. Electronically Signed   By: Randa Ngo M.D.   On: 04/15/2020 17:16   [place] EKG SITE RITE  Result Date: 04/15/2020 If Site Rite image not attached, placement could not be confirmed due to current cardiac rhythm.   Disposition: Discharge disposition: 01-Home or Self Care       Discharge Instructions    [facility] pharmacist to adjust dose for Vancomycin, Aminoglycosides and other anti-infective therapies as requested by physician.   Complete by: As directed    [facility] pharmacist to adjust dose for Vancomycin, Aminoglycosides and other anti-infective therapies as requested by physician.   Complete by: As directed    [facility] to provide Cath Flo 2mg    Complete by: As directed    Administer for PICC line occlusion and as ordered by physician for other access device issues.   [facility] to provide Cath Flo 2mg    Complete by: As directed    Administer for PICC line occlusion and as ordered by physician for other access device issues.   Anaphylaxis Kit: Provided to treat any anaphylactic reaction to the medication being provided to the patient if First Dose or when requested by physician   Complete by: As directed    Epinephrine 1mg /ml  vial / amp: Administer 0.3mg  (0.59ml) subcutaneously once for moderate to severe anaphylaxis, nurse to call physician and pharmacy when reaction occurs and call 911 if needed for immediate care   Diphenhydramine 50mg /ml IV vial: Administer 25-50mg  IV/IM PRN for first dose reaction, rash, itching, mild reaction, nurse to call physician and pharmacy when reaction occurs   Sodium Chloride 0.9% NS 529ml IV: Administer if needed for hypovolemic blood pressure drop or as ordered by physician after call to physician with anaphylactic reaction   Anaphylaxis Kit: Provided to treat any anaphylactic reaction to the medication being provided to the patient if First Dose or when requested by physician   Complete by: As directed    Epinephrine 1mg /ml vial / amp: Administer 0.3mg  (0.66ml) subcutaneously once for moderate to severe anaphylaxis, nurse to call physician and pharmacy when reaction occurs and call 911 if needed for immediate care   Diphenhydramine 50mg /ml IV vial: Administer 25-50mg  IV/IM PRN for first dose reaction, rash, itching, mild reaction, nurse to call physician and pharmacy when reaction occurs   Sodium Chloride 0.9% NS 541ml IV: Administer if needed for hypovolemic blood pressure drop or as ordered by physician after call to physician with anaphylactic reaction   Change dressing on IV access line weekly and PRN   Complete by: As directed    Change dressing on IV access line weekly and PRN   Complete by: As directed    Flush IV access with Sodium Chloride 0.9% and Heparin 10 units/ml or 100 units/ml   Complete by: As directed    Flush IV access with Sodium Chloride 0.9% and Heparin 10 units/ml or 100 units/ml   Complete by: As directed    Home infusion instructions - [facility]   Complete by: As directed    Instructions: Flush IV access with Sodium Chloride 0.9% and Heparin 10units/ml or 100units/ml   Change dressing on IV access line: Weekly and PRN   Instructions Cath Flo 2mg :  Administer for PICC Line occlusion and as ordered by physician for other access device   [facility] pharmacist to adjust dose for: Vancomycin, Aminoglycosides and other anti-infective therapies as requested by physician   Home infusion instructions - [facility]   Complete by: As directed    Instructions: Flush IV access with Sodium Chloride 0.9% and Heparin 10units/ml or 100units/ml   Change dressing on IV access line: Weekly and PRN   Instructions Cath Flo $Remove'2mg'[id number]$ : Administer for PICC Line occlusion and as ordered by physician for other access device   [facility] pharmacist to adjust dose for: Vancomycin, Aminoglycosides and other anti-infective therapies as requested by physician   Method of administration may be changed at the discretion of home infusion pharmacist based upon assessment of the patient and/or caregiver's ability to self-administer the medication ordered   Complete by: As directed    Method of administration may be changed at the discretion of home infusion pharmacist based upon assessment of the patient and/or caregiver's ability to self-administer the medication ordered   Complete by: As directed        Follow-up Information    [facility] Follow up.   Specialty: Home Health Services Contact information: [place] [address] [phone]        Marchia Bond, MD. Schedule an appointment as soon as possible for a visit in 1 week(s).   Specialty: Orthopedic Surgery Contact information: [address] [place]  [address] [phone]        Venia Carbon, MD Follow up.   Specialties: Internal Medicine, Pediatrics Contact information: [address] [place] [address] [phone]                Signed: Jola Baptist 04/16/2020, 2:35 PM

## 2020-04-16 NOTE — Progress Notes (Addendum)
INFECTIOUS DISEASE PROGRESS NOTE  ID: Roy Patel is a 60 y.o. male with  Principal Problem:   Pain and swelling of right shoulder Active Problems:   Post-operative infection  Subjective: No complaints, non -pain, has pic  Abtx:  Anti-infectives (From admission, onward)   Start     Dose/Rate Route Frequency Ordered Stop   04/15/20 1700  cefTRIAXone (ROCEPHIN) 2 g in sodium chloride 0.9 % 100 mL IVPB        2 g 200 mL/hr over 30 Minutes Intravenous Every 24 hours 04/15/20 1136     04/15/20 0400  vancomycin (VANCOCIN) IVPB 1000 mg/200 mL premix        1,000 mg 200 mL/hr over 60 Minutes Intravenous Every 12 hours 04/14/20 1843     04/15/20 0000  piperacillin-tazobactam (ZOSYN) IVPB 3.375 g  Status:  Discontinued        3.375 g 12.5 mL/hr over 240 Minutes Intravenous Every 8 hours 04/14/20 1843 04/15/20 1136   04/15/20 0000  cefTRIAXone (ROCEPHIN) IVPB        2 g Intravenous Every 24 hours 04/15/20 1234 05/27/20 2359   04/14/20 1600  piperacillin-tazobactam (ZOSYN) IVPB 3.375 g        3.375 g 12.5 mL/hr over 240 Minutes Intravenous To Surgery 04/14/20 1550 04/14/20 1600   04/14/20 1600  vancomycin (VANCOCIN) IVPB 1000 mg/200 mL premix        1,000 mg 200 mL/hr over 60 Minutes Intravenous To Surgery 04/14/20 1550 04/14/20 1700      Medications:  Scheduled: . Chlorhexidine Gluconate Cloth  6 each Topical Daily  . docusate sodium  100 mg Oral BID  . gabapentin  100 mg Oral QHS  . influenza vac split quadrivalent PF  0.5 mL Intramuscular Tomorrow-1000  . mupirocin ointment  1 application Nasal BID  . pneumococcal 23 valent vaccine  0.5 mL Intramuscular Tomorrow-1000  . senna  1 tablet Oral BID  . sodium chloride flush  10-40 mL Intracatheter Q12H    Objective: Vital signs in last 24 hours: Temp:  [98.1 F (36.7 C)-98.3 F (36.8 C)] 98.1 F (36.7 C) (01/01 0507) Pulse Rate:  [68-72] 72 (01/01 0507) Resp:  [16-17] 17 (01/01 0507) BP: (164-228)/(70-133) 170/82 (01/01  0507) SpO2:  [98 %-99 %] 99 % (01/01 0507)   General appearance: alert, cooperative and no distress L arm in sling  Lab Results Recent Labs    04/15/20 0114 04/16/20 0406  WBC 6.2 12.8*  HGB 12.6* 11.9*  HCT 37.4* 34.2*  NA 134* 137  K 4.4 3.7  CL 101 106  CO2 22 25  BUN 19 11  CREATININE 0.95 0.76   Liver Panel Recent Labs    04/14/20 1211  PROT 7.9  ALBUMIN 3.3*  AST 124*  ALT 122*  ALKPHOS 105  BILITOT 0.8   Sedimentation Rate No results for input(s): ESRSEDRATE in the last 72 hours. C-Reactive Protein No results for input(s): CRP in the last 72 hours.  Microbiology: Recent Results (from the past 240 hour(s))  SARS Coronavirus 2 by RT PCR (hospital order, performed in [facility] hospital lab) Nasopharyngeal Nasopharyngeal Swab     Status: None   Collection Time: 04/14/20 12:02 PM   Specimen: Nasopharyngeal Swab  Result Value Ref Range Status   SARS Coronavirus 2 NEGATIVE NEGATIVE Final    Comment: (NOTE) SARS-CoV-2 target nucleic acids are NOT DETECTED.  The SARS-CoV-2 RNA is generally detectable in upper and lower respiratory specimens during the acute phase of  infection. The lowest concentration of SARS-CoV-2 viral copies this assay can detect is 250 copies / mL. A negative result does not preclude SARS-CoV-2 infection and should not be used as the sole basis for treatment or other patient management decisions.  A negative result may occur with improper specimen collection / handling, submission of specimen other than nasopharyngeal swab, presence of viral mutation(s) within the areas targeted by this assay, and inadequate number of viral copies (<250 copies / mL). A negative result must be combined with clinical observations, patient history, and epidemiological information.  Fact Sheet for Patients:   BoilerBrush.com.cy  Fact Sheet for Healthcare Providers: https://pope.com/  This test is not yet  approved or  cleared by the [place] FDA and has been authorized for detection and/or diagnosis of SARS-CoV-2 by FDA under an Emergency Use Authorization (EUA).  This EUA will remain in effect (meaning this test can be used) for the duration of the COVID-19 declaration under Section 564(b)(1) of the Act, 21 U.S.C. section 360bbb-3(b)(1), unless the authorization is terminated or revoked sooner.  Performed at [facility], [address], [place], [place] [address]   Surgical pcr screen     Status: Abnormal   Collection Time: 04/14/20 12:50 PM   Specimen: Nasal Mucosa; Nasal Swab  Result Value Ref Range Status   MRSA, PCR NEGATIVE NEGATIVE Final   Staphylococcus aureus POSITIVE (A) NEGATIVE Final    Comment: (NOTE) The Xpert SA Assay (FDA approved for NASAL specimens in patients 32 years of age and older), is one component of a comprehensive surveillance program. It is not intended to diagnose infection nor to guide or monitor treatment. Performed at [facility], [address] [place], [place] [address]   Aerobic/Anaerobic Culture (surgical/deep wound)     Status: None (Preliminary result)   Collection Time: 04/14/20  3:51 PM   Specimen: PATH Other; Body Fluid  Result Value Ref Range Status   Specimen Description JOINT FLUID RIGHT SHOULDER  Final   Special Requests HOLD P. ACNES 2WKS  Final   Gram Stain   Final    MODERATE WBC PRESENT, PREDOMINANTLY PMN NO ORGANISMS SEEN Performed at [facility], [address] [place], [place] [address]    Culture PENDING  Incomplete   Report Status PENDING  Incomplete  Aerobic/Anaerobic Culture (surgical/deep wound)     Status: None (Preliminary result)   Collection Time: 04/14/20  4:02 PM   Specimen: PATH Other; Body Fluid  Result Value Ref Range Status   Specimen Description JOINT FLUID RIGHT SHOULDER  Final   Special Requests SUBACROMIAL  Final   Gram Stain   Final    ABUNDANT WBC PRESENT,BOTH PMN AND  MONONUCLEAR NO ORGANISMS SEEN    Culture   Final    NO GROWTH < 24 HOURS Performed at [facility], [address] [place], [place] [address]    Report Status PENDING  Incomplete    Studies/Results: DG Chest 2 View  Result Date: 04/15/2020 CLINICAL DATA:  Cough, hypertension EXAM: CHEST - 2 VIEW COMPARISON:  12/03/2016 FINDINGS: Frontal and lateral views of the chest demonstrate a stable cardiac silhouette. Chronic scarring at the left base. No airspace disease, effusion, or pneumothorax. Left-sided PICC tip projects over the superior vena cava. There is subcutaneous gas in the right supraclavicular region overlying the lateral margin of the right clavicle. By report, the patient has had recent right shoulder surgery. IMPRESSION: 1. No acute intrathoracic process. 2. Subcutaneous gas  at the right shoulder, please correlate with recent procedure or surgery. Electronically Signed   By: Randa Ngo M.D.   On: 04/15/2020 17:16   [place] EKG SITE RITE  Result Date: 04/15/2020 If Site Rite image not attached, placement could not be confirmed due to current cardiac rhythm.    Assessment/Plan: Septic arthritis Hep C ab+ HTN  Total days of antibiotics: 1 vanco/ceftriaxone  Cr stable Await Cx Await Hep C RNA He can be d/c if Dr Mardelle Matte wishes- I am happy to follow his Cx as outpt and adjust anbx as needed.  I gave him my card and number to make f/u appt (2-3 weeks). I have notified our office   Allergies  Allergen Reactions  . Augmentin [Amoxicillin-Pot Clavulanate] Nausea And Vomiting    "throws up violently"  . Chlorthalidone Other (See Comments)    Felt bad/lethargic  . Sulfa Antibiotics Other (See Comments)    Jaundice    OPAT Orders Discharge antibiotics to be given via PICC line Discharge antibiotics: ceftriaxone 2 g ivpb qday Per pharmacy protocol vancomycin Aim for Vancomycin trough 15-20 or AUC 400-550 (unless otherwise indicated) Duration: 42 days End  Date: 05-27-20  [facility] Care Per Protocol: please  Home health RN for IV administration and teaching; PICC line care and labs.    Labs weekly while on IV antibiotics: _x_ CBC with differential __ BMP _x_ CMP _x_ CRP _x_ ESR _x_ Vancomycin trough __ CK  _x_ Please pull PIC at completion of IV antibiotics __ Please leave PIC in place until doctor has seen patient or been notified  Fax weekly labs to [phone]  Clinic Follow Up Appt: Sophie Quiles 4-6 weeks.          Bobby Rumpf MD, FACP Infectious Diseases (pager) [phone] [email or web address] 04/16/2020, 8:08 AM  LOS: 2 days

## 2020-04-16 NOTE — Progress Notes (Signed)
PHARMACY CONSULT NOTE FOR:  OUTPATIENT  PARENTERAL ANTIBIOTIC THERAPY (OPAT)  Indication: septic arthritis Regimen: vancomycin 1000 mg IV q12h End date: 05/27/2020  IV antibiotic discharge orders are pended. To discharging provider:  please sign these orders via discharge navigator,  Select New Orders & click on the button choice - Manage This Unsigned Work.     Thank you for allowing pharmacy to be a part of this patient's care.  Lulu Riding, PharmD, BCPS PGY2 Pharmacy Resident Phone between 7 am - 3:30 pm: [phone]  Please check AMION for all [facility] phone numbers After 10:00 PM, call Main Pharmacy [phone]  04/16/2020, 10:17 AM

## 2020-04-16 NOTE — Progress Notes (Signed)
Roy Patel to be D/C'd  per MD order.  Discussed with the patient and all questions fully answered.  VSS, Skin clean, dry and intact without evidence of skin break down, no evidence of skin tears noted.  An After Visit Summary was printed and given to the patient. Per wife, patient received prescription.  D/c education completed with patient/family including follow up instructions, medication list, d/c activities limitations if indicated, with other d/c instructions as indicated by MD - patient able to verbalize understanding, all questions fully answered.   Patient instructed to return to ED, call 911, or call MD for any changes in condition.   Patient to be escorted via WC, and D/C home via private auto.

## 2020-04-16 NOTE — Discharge Instructions (Signed)
Diet: As you were doing prior to hospitalization   Shower:  May shower but keep the wounds dry, use an occlusive plastic wrap, NO SOAKING IN TUB.  If the bandage gets wet, change with a clean dry gauze.  If you have a splint on, leave the splint in place and keep the splint dry with a plastic bag.  Dressing:  You may change your dressing 3-5 days after surgery, unless you have a splint.  If you have a splint, then just leave the splint in place and we will change your bandages during your first follow-up appointment.    If you had hand or foot surgery, we will plan to remove your stitches in about 2 weeks in the office.  For all other surgeries, there are sticky tapes (steri-strips) on your wounds and all the stitches are absorbable.  Leave the steri-strips in place when changing your dressings, they will peel off with time, usually 2-3 weeks.  Activity:  Increase activity slowly as tolerated, but follow the weight bearing instructions below.  The rules on driving is that you can not be taking narcotics while you drive, and you must feel in control of the vehicle.    Weight Bearing:   Non weight bearing right arm  To prevent constipation: you may use a stool softener such as -  Colace (over the counter) 100 mg by mouth twice a day  Drink plenty of fluids (prune juice may be helpful) and high fiber foods Miralax (over the counter) for constipation as needed.    Itching:  If you experience itching with your medications, try taking only a single pain pill, or even half a pain pill at a time.  You may take up to 10 pain pills per day, and you can also use benadryl over the counter for itching or also to help with sleep.   Precautions:  If you experience chest pain or shortness of breath - call 911 immediately for transfer to the hospital emergency department!!  If you develop a fever greater that 101 F, purulent drainage from wound, increased redness or drainage from wound, or calf pain -- Call the  office at [phone]                                                Follow- Up Appointment:  Please call for an appointment to be seen in 2 weeks [place] - [phone]     

## 2020-04-18 ENCOUNTER — Telehealth: Payer: Self-pay | Admitting: Infectious Diseases

## 2020-04-19 NOTE — Telephone Encounter (Signed)
error 

## 2020-04-20 ENCOUNTER — Telehealth: Payer: Self-pay

## 2020-04-20 LAB — HEPATITIS C GENOTYPE

## 2020-04-20 LAB — HCV RNA QUANT RFLX ULTRA OR GENOTYP

## 2020-04-20 LAB — HCV RNA (INTERNATIONAL UNITS)
HCV log10: 7.442 log10 IU/mL
Hcv Rna (International Units): 27700000 IU/mL

## 2020-04-20 NOTE — Telephone Encounter (Cosign Needed)
Per Dr Ninetta Lights , [facility] informed to discontinue Ceftriaxone and continue Vancomycin.   Laurell Josephs , RN

## 2020-04-21 ENCOUNTER — Encounter: Payer: Self-pay | Admitting: Infectious Diseases

## 2020-04-22 ENCOUNTER — Telehealth: Payer: Self-pay

## 2020-04-22 LAB — AEROBIC/ANAEROBIC CULTURE W GRAM STAIN (SURGICAL/DEEP WOUND)

## 2020-04-22 NOTE — Telephone Encounter (Signed)
Verbal orders given to Roy Patel at [facility] to restart Ceftriaxone 2g Q24H with end date of 05/27/20. Patient is still receiving IV vancomycin. Labs remain the same.   Jawaun Celmer Loyola Mast, RN

## 2020-04-28 LAB — AEROBIC/ANAEROBIC CULTURE W GRAM STAIN (SURGICAL/DEEP WOUND): Culture: NO GROWTH

## 2020-05-03 ENCOUNTER — Telehealth: Payer: Self-pay | Admitting: *Deleted

## 2020-05-03 NOTE — Telephone Encounter (Signed)
Spoke to pt. He will come in if it is elevated when they check it at home when the nurse is not there.

## 2020-05-03 NOTE — Telephone Encounter (Signed)
Not sure the right phone number was given in the message for the nurse. The name on the VM was not the name of the nurse. I called it twice to verify. I did not leave a message.

## 2020-05-03 NOTE — Telephone Encounter (Signed)
Diane nurse with [facility] left a voicemail stating that she went out to see the patient and his blood pressure was 170/178-102/104. Diane stated that patient is nos symptomatic today. Diane stated that he was symptomatic with a headache yesterday but did not check his blood pressure at that time. Diane stated that patient told her that he has white coat syndrome and is wondering if his blood pressure may have been up because she was there. Diane stated that the patient does not have a blood pressure machine but his significant other is a nurse and she has asked her to check his blood pressure when she gets home from work.  Diane stated to call her if you have any questions.

## 2020-05-03 NOTE — Telephone Encounter (Signed)
He has had significant complications after shoulder surgery--so tough to tell (and he hadn't been seen here in some time). Will probably need to come in if his wife also gets his BP as elevated

## 2020-05-03 NOTE — Telephone Encounter (Signed)
Agree with home monitoring and appt if elevated.

## 2020-05-05 ENCOUNTER — Telehealth: Payer: Self-pay | Admitting: Pharmacist

## 2020-05-05 ENCOUNTER — Telehealth: Payer: Self-pay

## 2020-05-05 DIAGNOSIS — R110 Nausea: Secondary | ICD-10-CM

## 2020-05-05 NOTE — Telephone Encounter (Signed)
Called and spoke to [name], [facility] pharmacist, regarding patient's labs. Patient is on vancomycin + ceftriaxone until 2/11 for septic arthritis. Recent vanc trough was elevated at 26 and SCr increased from 0.76 >> 1.29. Per Larita Fife, patient's vancomycin has been on hold since yesterday evening, and they are repeating labs tomorrow. Will update clinic if still elevated. Patient is still receiving ceftriaxone.

## 2020-05-05 NOTE — Telephone Encounter (Signed)
Received call from [facility] with [facility] stating the patient's wife was reporting nausea and oral zofran was not helping much. Followed up with wife who states that he's had nausea two times and they were concerned it was related to his elevated vanc trough levels. Spoke with Cassie, [facility] pharmacist who said that's not a side effect related to the levels.   Jolene stated that the oral zofran has helped take the edge off, however last night he woke up extremely nauseous which was a first for them. Nausea doesn't occur around infusions; usually hours later.No vomitting reported. Wondering if they can be prescribed something else to assist.   Rosanna Randy, RN

## 2020-05-06 ENCOUNTER — Telehealth: Payer: Self-pay

## 2020-05-06 MED ORDER — PROMETHAZINE HCL 25 MG PO TABS: 25.0000 mg | ORAL_TABLET | Freq: Four times a day (QID) | ORAL | 1 refills | Status: AC | PRN

## 2020-05-06 MED ORDER — LOSARTAN POTASSIUM-HCTZ 50-12.5 MG PO TABS: 1.0000 | ORAL_TABLET | Freq: Every day | ORAL | 0 refills | Status: DC

## 2020-05-06 NOTE — Addendum Note (Signed)
Addended by: Linna Hoff D on: 05/06/2020 01:41 PM   Modules accepted: Orders

## 2020-05-06 NOTE — Telephone Encounter (Signed)
Left message for Diane. Called pt and spoke to him. Advised him he has to keep the appt on 05-10-20. Sent rx to [facility].

## 2020-05-06 NOTE — Telephone Encounter (Signed)
Received call from [place] at [facility]. He states home health nurse called to report that patient's BP was 178/111 this morning and has been running high since Monday. Larita Fife states patient's wife is a Engineer, civil (consulting) and has been monitoring it. Per Larita Fife, home health nurse advised patient to seek care in the emergency department. Larita Fife states he left a message for the patient's PCP Dr. Alphonsus Sias to make him aware of the situation.   RN called patient, he states no one advised him to go to the ED. He is unable to drive himself to the emergency department. Patient reports some nausea and headaches at night. RN advised patient to follow-up with his PCP in regards to his elevated blood pressure and follow their recommendations. Patient states their office doesn't open until 10 am. Patient states either he or his wife will call Dr. Karle Starch office when they open.   Sandie Ano, RN

## 2020-05-06 NOTE — Telephone Encounter (Signed)
He can try phenergan. Will make him drowsy  Can give him 25 mg daily as needed for nausea. # 20. 1 refill. I cant do that from my phone and cant log in from home if you could call it in or send it for me. Thanks

## 2020-05-06 NOTE — Telephone Encounter (Signed)
Patient's wife returned the call. RN relayed message about [facility] [place]. Andree Coss, RN

## 2020-05-06 NOTE — Telephone Encounter (Signed)
Yes, that is fine, every 6 hours as needed #20, 1 refill. Thanks

## 2020-05-06 NOTE — Telephone Encounter (Signed)
This is a tough situation because I haven't even seen him in 4 years (technically is a new patient again). I am okay with sending Rx for losartan/HCTZ 50/12.5   #30 x 0 He needs an appt here ASAP

## 2020-05-06 NOTE — Telephone Encounter (Signed)
Attempted to call patient to let him know we are sending in phenergan for his nausea. No answer and voicemail box full. Called patient's wife and left HIPAA compliant message requesting call back so that we can let her know we have sent in the phenergan.   Sandie Ano, RN

## 2020-05-06 NOTE — Telephone Encounter (Signed)
Diane with [facility] called to let you know she saw pt this morning and his BP was 181/111 pt also states that he is not feeling well and is still feeling nauseous (minimal relief with Zofran) and has a headache. Also called on 05/03/2020 in reference to elevated BP...  Diane states spouse has been checking BP at home since Tues and has also been getting elevated BP readings 180/100 and 170/100....   Diane asks that you call both her and the patient back with any advice

## 2020-05-09 NOTE — Telephone Encounter (Signed)
Thanks everybody 

## 2020-05-10 ENCOUNTER — Encounter: Payer: Self-pay | Admitting: Internal Medicine

## 2020-05-10 ENCOUNTER — Ambulatory Visit: Payer: 59 | Admitting: Internal Medicine

## 2020-05-10 ENCOUNTER — Telehealth: Payer: Self-pay

## 2020-05-10 ENCOUNTER — Other Ambulatory Visit: Payer: Self-pay

## 2020-05-10 DIAGNOSIS — L270 Generalized skin eruption due to drugs and medicaments taken internally: Secondary | ICD-10-CM

## 2020-05-10 DIAGNOSIS — T8140XD Infection following a procedure, unspecified, subsequent encounter: Secondary | ICD-10-CM

## 2020-05-10 DIAGNOSIS — I10 Essential (primary) hypertension: Secondary | ICD-10-CM

## 2020-05-10 DIAGNOSIS — F17200 Nicotine dependence, unspecified, uncomplicated: Secondary | ICD-10-CM

## 2020-05-10 MED ORDER — AMLODIPINE BESYLATE 5 MG PO TABS: 5.0000 mg | ORAL_TABLET | Freq: Every day | ORAL | 3 refills | Status: DC

## 2020-05-10 NOTE — Telephone Encounter (Signed)
Stop ceftraixone.  Let's pull his PIC as well.  Consider stop losartan, he should ask his prescribing doctor, PCP

## 2020-05-10 NOTE — Telephone Encounter (Signed)
RN spoke with patient's wife to let her know that Dr. Ninetta Lights wants the patient to stop the ceftriaxone and that we are going to have home health come out to pull the PICC line. Advised her that further treatment will be discussed at the patient's upcoming appointment on 05/12/20. Patient's wife verbalized understanding and has no further questions. She also mentioned that the patient's PCP is having him stop the losartan and is switching him to amlodipine.   RN spoke to Ivy at [facility] to relay verbal orders per Dr. Ninetta Lights to stop ceftrixone and pull PICC. Orders repeated and verified.   Sandie Ano, RN

## 2020-05-10 NOTE — Patient Instructions (Signed)
Stop the losartan/HCTZ  Start the amlodipine and let me know if you have any problems with this.

## 2020-05-10 NOTE — Assessment & Plan Note (Signed)
Classic rash started 3 days after losartan/HCTZ Will stop this Will let Dr Ninetta Lights know--in case he feels it could be related to the vanco (though off that for now)

## 2020-05-10 NOTE — Progress Notes (Signed)
Subjective:    Patient ID: Roy Patel, male    DOB: Feb 12, 1961, 60 y.o.   MRN: [id number]  HPI Here to reestablish care and due to elevated blood pressure With wife This visit occurred during the SARS-CoV-2 public health emergency.  Safety protocols were in place, including screening questions prior to the visit, additional usage of staff PPE, and extensive cleaning of exam room while observing appropriate contact time as indicated for disinfecting solutions.   Had right rotator cuff repair 03/17/20 Did really well Then things went bad after Christmas Started having pain and tightness--and heat Went back to Dr Dirk Dress was concerned about infection He drew out fluid--showed infection Second procedure---floating hardware and spacer removed. One thing left Lavaged and started parenteral antibiotics Vancomycin at home---recently increased due to low levels (then level went up and creatinine also went up) Now off vancomycin--but may go back or switch to daptomycin Still on rocephin 2 gm daily IV  Now has rash all over body--this is new since yesterday Restarted losartan/HCTZ 4 days ago  BP as high as 220/120 in hospital Has stayed high--see home health notes Mostly 180/100 or so Started the losartan and BP much better  Current Outpatient Medications on File Prior to Visit  Medication Sig Dispense Refill  . acetaminophen (TYLENOL) 325 MG tablet Take 1,000 mg by mouth every 6 (six) hours as needed for moderate pain.    . baclofen (LIORESAL) 10 MG tablet Take 1 tablet (10 mg total) by mouth 3 (three) times daily. As needed for muscle spasm 30 tablet 0  . cefTRIAXone (ROCEPHIN) IVPB Inject 2 g into the vein daily. Indication: Septic arthritis First Dose: No Last Day of Therapy:  05/27/2020 Labs - Once weekly:  CBC/D and BMP, Labs - Every other week:  ESR and CRP Method of administration: IV Push Method of administration may be changed at the discretion of home infusion pharmacist  based upon assessment of the patient and/or caregiver's ability to self-administer the medication ordered. 42 Units 0  . diazepam (VALIUM) 2 MG tablet Take 1 tablet (2 mg total) by mouth every 12 (twelve) hours as needed for anxiety. 10 tablet 0  . gabapentin (NEURONTIN) 300 MG capsule Take 300 mg by mouth 3 (three) times daily.    Marland Kitchen HYDROcodone-acetaminophen (NORCO/VICODIN) 5-325 MG tablet Take 1 tablet by mouth every 6 (six) hours as needed for moderate pain.    Marland Kitchen losartan-hydrochlorothiazide (HYZAAR) 50-12.5 MG tablet Take 1 tablet by mouth daily. 30 tablet 0  . meloxicam (MOBIC) 15 MG tablet Take 15 mg by mouth daily.    . ondansetron (ZOFRAN) 4 MG tablet Take 1 tablet (4 mg total) by mouth every 8 (eight) hours as needed for nausea or vomiting. 10 tablet 0  . oxyCODONE (OXY IR/ROXICODONE) 5 MG immediate release tablet Take 5 mg by mouth every 4 (four) hours as needed for severe pain.    Marland Kitchen PROAIR HFA 108 (90 Base) MCG/ACT inhaler TAKE 2 PUFFS BY MOUTH EVERY 6 HOURS AS NEEDED FOR WHEEZE OR SHORTNESS OF BREATH (Patient taking differently: Inhale 1 puff into the lungs every 4 (four) hours as needed for wheezing or shortness of breath.) 8.5 Inhaler 2  . promethazine (PHENERGAN) 25 MG tablet Take 1 tablet (25 mg total) by mouth every 6 (six) hours as needed for nausea or vomiting. 20 tablet 1  . SUMAtriptan (IMITREX) 6 MG/0.5ML SOLN injection Inject 6 mg into the skin as needed (for cluster headaches).     No current  facility-administered medications on file prior to visit.    Allergies  Allergen Reactions  . Augmentin [Amoxicillin-Pot Clavulanate] Nausea And Vomiting    "throws up violently"  . Chlorthalidone Other (See Comments)    Felt bad/lethargic  . Sulfa Antibiotics Other (See Comments)    Jaundice    Past Medical History:  Diagnosis Date  . Cluster headache   . G6PD deficiency    ?  Marland Kitchen Hypertension   . Pleurisy     Past Surgical History:  Procedure Laterality Date  .  ANTERIOR CRUCIATE LIGAMENT REPAIR  2009   MCL removed ([facility])  . EXTENSOR TENDON OF FOREARM / WRIST REPAIR  ~1970   Left  . IRRIGATION AND DEBRIDEMENT SHOULDER Right 04/14/2020   Procedure: IRRIGATION AND DEBRIDEMENT SHOULDER;  Surgeon: Marchia Bond, MD;  Location: [facility];  Service: Orthopedics;  Laterality: Right;  . [place], Right knee  . MENISCUS REPAIR  2000   [place], Left knee  . SHOULDER ARTHROSCOPY Right 04/14/2020   Procedure: ARTHROSCOPY SHOULDER with debridement;  Surgeon: Marchia Bond, MD;  Location: [facility];  Service: Orthopedics;  Laterality: Right;    Family History  Problem Relation Age of Onset  . Cancer Mother        Lung  . Diabetes Father   . Heart disease Father        CHF  . Colon polyps Brother   . Colon polyps Brother   . Hypertension Neg Hx     Social History   Socioeconomic History  . Marital status: Married    Spouse name: Not on file  . Number of children: 1  . Years of education: Not on file  . Highest education level: Not on file  Occupational History  . Occupation: Herbalist at [employer]  . Smoking status: Current Every Day Smoker    Packs/day: 1.00    Types: Cigarettes  . Smokeless tobacco: Never Used  Vaping Use  . Vaping Use: Never used  Substance and Sexual Activity  . Alcohol use: Yes    Alcohol/week: 2.0 standard drinks    Types: 2 Cans of beer per week    Comment: occasional  . Drug use: Never  . Sexual activity: Not on file  Other Topics Concern  . Not on file  Social History Narrative   Married:  2nd   Son in [place]   Step daughter here.   Social Determinants of Health   Financial Resource Strain: Not on file  Food Insecurity: Not on file  Transportation Needs: Not on file  Physical Activity: Not on file  Stress: Not on file  Social Connections: Not on file  Intimate Partner Violence: Not on file   Review of Systems Had headaches till the last day or so (relates  to BP coming down) Has had rare "weird" chest feeling---like a twinge (just sitting around). Lasted a few minutes Lost 15# since the infection started---slowly getting it back on Breathing is okay Has sciatic nerve symptoms in right leg---affects his sleep and leg strength Everything turns red when in hot water--like bottom of feet and other spots on skin    Objective:   Physical Exam Constitutional:      General: He is not in acute distress. Cardiovascular:     Rate and Rhythm: Normal rate and regular rhythm.     Heart sounds: No murmur heard. No gallop.   Pulmonary:     Effort: Pulmonary effort  is normal.     Breath sounds: Normal breath sounds. No wheezing or rales.  Musculoskeletal:     Cervical back: Neck supple.  Lymphadenopathy:     Cervical: No cervical adenopathy.  Skin:    Comments: Striking generalized truncal rash No mucus membrane involvement  Neurological:     Mental Status: He is alert.  Psychiatric:        Mood and Affect: Mood normal.        Behavior: Behavior normal.            Assessment & Plan:

## 2020-05-10 NOTE — Telephone Encounter (Signed)
Received call from patient's wife. She states patient has developed a rash on his trunk yesterday afternoon. Today it has spread to his back, arms, and chest. It is itchy and red. She also reports the patient has a temperature of 99.6. Denies symptoms of shortness of breath, wheezing, or difficulty breathing. RN instructed patient's wife that if these symptoms develop, the patient needs to seek care in the emergency department, she verbalized understanding.   The patient's vancomycin was stopped last week due to high levels. He is currently getting Rocephin at night and he recently started Losartan on Friday.   The patient has an appointment with his PCP today and is scheduled to see Dr. Ninetta Lights on 05/12/20. Patient's wife is also wondering if/when vancomycin or daptomycin will be started. Will route to provider.   Sandie Ano, RN

## 2020-05-10 NOTE — Assessment & Plan Note (Addendum)
BP Readings from Last 3 Encounters:  05/10/20 140/84  04/16/20 (!) 171/77  05/24/17 (!) 164/80   Marked elevations which are much better on the losartan/HCTZ New drug rash though--and timing suggests this med Will stop this Start amlodpine 5mg  daily Will stop the meloxicam since that could be increasing the BP

## 2020-05-10 NOTE — Assessment & Plan Note (Signed)
He is not ready to stop

## 2020-05-10 NOTE — Assessment & Plan Note (Signed)
Needs 6 weeks of antibiotics per Dr Ninetta Lights for infection in right shoulder The shoulder is actually doing better now

## 2020-05-12 ENCOUNTER — Encounter: Payer: Self-pay | Admitting: Infectious Diseases

## 2020-05-12 ENCOUNTER — Ambulatory Visit: Payer: 59 | Admitting: Infectious Diseases

## 2020-05-12 ENCOUNTER — Other Ambulatory Visit: Payer: Self-pay

## 2020-05-12 DIAGNOSIS — T8140XD Infection following a procedure, unspecified, subsequent encounter: Secondary | ICD-10-CM

## 2020-05-12 DIAGNOSIS — B182 Chronic viral hepatitis C: Secondary | ICD-10-CM | POA: Diagnosis not present

## 2020-05-12 MED ORDER — AMOXICILLIN 500 MG PO CAPS
500.0000 mg | ORAL_CAPSULE | Freq: Three times a day (TID) | ORAL | 3 refills | Status: DC
Start: 2020-05-12 — End: 2020-09-14

## 2020-05-12 NOTE — Assessment & Plan Note (Signed)
I offered to refer him to pharm but they wish to defer til he completes therapy for his shoulder.  Will send to pharm to stage.  Start rx after off anbx.

## 2020-05-12 NOTE — Assessment & Plan Note (Addendum)
He appears to be doing well.  Will f/u his labs from home health Will change his anbx to amoxil for tid for 3 months.  Has had issues with augmentin prior, n/v.  Will see him back in 1 month.   1-10 1-3 ESR  37 36 CRP 2 18

## 2020-05-12 NOTE — Progress Notes (Signed)
   Subjective:    Patient ID: Roy Patel, male    DOB: Apr 22, 1960, 60 y.o.   MRN: [id number]  HPI Roy Patel is a 60 y.o. male with hx of R rotator cuff surgery, 3 tendon repairs and bone spurs removed 03-17-20 (outpt). By 12-25, pain and swelling resolved. He then developed "masssive pain" and swelling. He came to Dr Shelba Flake office 12-29 and had joint aspiration which was felt to be consistent with infection. He had I & D 12-30 and his Cx grew P acnes, Staph haemolyticus (R-ox),  Staph hominis (pan-sens). He was d/c home on vanco/ceftriaxone on 04-16-20.  He has since had issues with rash due to either vanco/rocephin/losarten from his [facility] and it was removed 05-12-20. (28 days of therapy). Still has limitation in ROM. No pain unless he tries to use. No swelling. No heat.   1 piece of hardware removed, 1 remains.   He was also found to be Hep C+ (1a, 27.7 million viral load).   Has been turning red when hs gets in the shower. Has only been for the last month   Review of Systems  Constitutional: Positive for unexpected weight change (15# down with gradual return). Negative for appetite change, chills and fever.  Gastrointestinal: Positive for constipation. Negative for diarrhea.  Genitourinary: Negative for difficulty urinating.  Musculoskeletal: Positive for arthralgias.  Please see HPI. All other systems reviewed and negative.      Objective:   Physical Exam Vitals reviewed.  Constitutional:      Appearance: Normal appearance.  HENT:     Mouth/Throat:     Mouth: Mucous membranes are moist.     Pharynx: No oropharyngeal exudate.  Eyes:     Extraocular Movements: Extraocular movements intact.     Pupils: Pupils are equal, round, and reactive to light.  Cardiovascular:     Rate and Rhythm: Normal rate and regular rhythm.  Pulmonary:     Effort: Pulmonary effort is normal.     Breath sounds: Normal breath sounds.  Abdominal:     General: Bowel sounds are normal. There is  no distension.     Palpations: Abdomen is soft.     Tenderness: There is no abdominal tenderness.  Musculoskeletal:       Arms:     Cervical back: Normal range of motion and neck supple.     Right lower leg: No edema.     Left lower leg: No edema.  Neurological:     Mental Status: He is alert.           Assessment & Plan:

## 2020-05-13 ENCOUNTER — Telehealth: Payer: Self-pay

## 2020-05-13 NOTE — Telephone Encounter (Signed)
[facility] Patient Advocate Encounter   Received notification from [facility] that prior authorization for Roy Patel is required.   PA submitted on 05/13/20 Key [id number] Status is pending    [facility] will continue to follow.   Clearance Coots, CPhT Specialty Pharmacy Patient [facility] : [facility] Phone: [phone] Fax:  [phone]

## 2020-05-17 ENCOUNTER — Telehealth: Payer: Self-pay

## 2020-05-17 NOTE — Telephone Encounter (Signed)
[facility] Patient Advocate Encounter  Prior Authorization for Dorita Fray  has been approved.    PA# [id number] Effective dates: 04/17/20 through 08/09/20  Prescription have to be filled through [facility] Fax # [phone].   I will check and make sure the patient copay is no charge.  [facility] will continue to follow.  Clearance Coots, CPhT Specialty Pharmacy Patient [facility] Phone: [phone] Fax:  [phone]

## 2020-05-28 ENCOUNTER — Other Ambulatory Visit: Payer: Self-pay | Admitting: Internal Medicine

## 2020-06-02 ENCOUNTER — Telehealth: Payer: Self-pay

## 2020-06-02 ENCOUNTER — Ambulatory Visit: Payer: 59 | Admitting: Infectious Diseases

## 2020-06-02 MED ORDER — LISINOPRIL 20 MG PO TABS: 20.0000 mg | ORAL_TABLET | Freq: Every day | ORAL | 3 refills | Status: DC

## 2020-06-02 NOTE — Telephone Encounter (Signed)
Please send lisinopril 20mg  daily to add to the amlodipine. Have him take the first dose at bedtime--then take in the morning (can cause dizziness--especially with the first dose). If elevated BP persists, will consider beta blocker  Can send #90 x 3

## 2020-06-02 NOTE — Telephone Encounter (Signed)
Spoke to pt's wife. Sent in lisinopril to [facility].

## 2020-06-02 NOTE — Telephone Encounter (Signed)
Pt was changed to amlodipine 5mg  daily due to having a rash from Losartan. His BP has been 180s-190s/100s. He said he is having some palpitations. He has been off caffeine drinks since December, but this week just started drinking tea. Please advise wife what to do at [phone]. [facility].

## 2020-06-16 ENCOUNTER — Other Ambulatory Visit: Payer: Self-pay

## 2020-06-16 ENCOUNTER — Ambulatory Visit: Payer: 59 | Admitting: Infectious Diseases

## 2020-06-16 ENCOUNTER — Encounter: Payer: Self-pay | Admitting: Infectious Diseases

## 2020-06-16 VITALS — BP 130/85 | HR 105 | Temp 97.3°F | Wt 144.0 lb

## 2020-06-16 DIAGNOSIS — M25411 Effusion, right shoulder: Secondary | ICD-10-CM

## 2020-06-16 DIAGNOSIS — B182 Chronic viral hepatitis C: Secondary | ICD-10-CM | POA: Diagnosis not present

## 2020-06-16 DIAGNOSIS — T8140XD Infection following a procedure, unspecified, subsequent encounter: Secondary | ICD-10-CM | POA: Diagnosis not present

## 2020-06-16 DIAGNOSIS — M25511 Pain in right shoulder: Secondary | ICD-10-CM

## 2020-06-16 NOTE — Assessment & Plan Note (Signed)
He appears to be doing fairly well Will continue his amoxil though I have some concerns that it could be related to his fatigue and nausea. He and his wife wish to continue to taking rx.  Will check his esr and crp today.  rtc in 6 weeks.

## 2020-06-16 NOTE — Progress Notes (Signed)
   Subjective:    Patient ID: Roy Patel, male    DOB: May 13, 1960, 60 y.o.   MRN: [id number]  HPI Roy Patel a 60 y.o.malewith hx of R rotator cuff surgery, 3 tendon repairs and bone spurs removed 03-17-20 (outpt). By 12-25, pain and swelling resolved. He then developed "masssive pain" and swelling. He came to Dr Luanna Cole office 12-29 and had joint aspiration which was felt to be consistent with infection. He had I & D 12-30 and his Cx grew P acnes, Staph haemolyticus (R-ox),  Staph hominis (pan-sens). He was d/c home on vanco/ceftriaxone on 04-16-20.  He has since had issues with rash due to either vanco/rocephin/losarten from his [facility] and it was removed 05-12-20. (28 days of therapy). At his f/u 05-12-20 he was changed to po amoxil for 3 months.   1 piece of hardware removed, 1 remains.   He still has some limitation of movement of his R shoulder- over the shoulder, adduction. Pain limits.  No heat or swelling. Saw Dr Mardelle Matte last month, not yet released to go back to work.  No problems with amoxil. No rashes, diarrhea.  occas nausea without emesis.    He was also found to be Hep C+ (1a, 27.7 million viral load).               1-10     1-3 ESR     37        36 CRP     2          18  Review of Systems  Constitutional: Positive for fatigue. Negative for chills, fever and unexpected weight change.  Gastrointestinal: Positive for constipation (using stool softener) and nausea. Negative for diarrhea and vomiting.  Genitourinary: Negative for difficulty urinating.  Skin: Positive for rash.       Objective:   Physical Exam Vitals reviewed.  Constitutional:      Appearance: Normal appearance.  HENT:     Mouth/Throat:     Mouth: Mucous membranes are moist.     Pharynx: Oropharynx is clear. No oropharyngeal exudate.  Eyes:     Extraocular Movements: Extraocular movements intact.     Pupils: Pupils are equal, round, and reactive to light.  Cardiovascular:     Rate and  Rhythm: Normal rate and regular rhythm.  Pulmonary:     Effort: Pulmonary effort is normal.     Breath sounds: Normal breath sounds.  Abdominal:     General: Bowel sounds are normal. There is no distension.     Palpations: Abdomen is soft.     Tenderness: There is no abdominal tenderness.  Musculoskeletal:        General: Normal range of motion.     Cervical back: Normal range of motion and neck supple.     Right lower leg: No edema.     Left lower leg: No edema.  Neurological:     General: No focal deficit present.     Mental Status: He is alert.  Psychiatric:        Mood and Affect: Mood normal.           Assessment & Plan:

## 2020-06-16 NOTE — Assessment & Plan Note (Addendum)
Will treat pt with Epclusa (apprvoed) when he completes rx for his shoulder.  He continues to wish to defer this rx til he completes his therapy for his shoulder.

## 2020-06-17 ENCOUNTER — Other Ambulatory Visit: Payer: Self-pay | Admitting: Infectious Diseases

## 2020-06-17 LAB — COMPREHENSIVE METABOLIC PANEL
AG Ratio: 1.2 (calc) (ref 1.0–2.5)
ALT: 364 U/L — ABNORMAL HIGH (ref 9–46)
AST: 301 U/L — ABNORMAL HIGH (ref 10–35)
Albumin: 4.3 g/dL (ref 3.6–5.1)
Alkaline phosphatase (APISO): 101 U/L (ref 35–144)
BUN: 22 mg/dL (ref 7–25)
CO2: 27 mmol/L (ref 20–32)
Calcium: 10.2 mg/dL (ref 8.6–10.3)
Chloride: 102 mmol/L (ref 98–110)
Creat: 1 mg/dL (ref 0.70–1.33)
Globulin: 3.6 g/dL (calc) (ref 1.9–3.7)
Glucose, Bld: 104 mg/dL — ABNORMAL HIGH (ref 65–99)
Potassium: 5.1 mmol/L (ref 3.5–5.3)
Sodium: 136 mmol/L (ref 135–146)
Total Bilirubin: 0.9 mg/dL (ref 0.2–1.2)
Total Protein: 7.9 g/dL (ref 6.1–8.1)

## 2020-06-17 LAB — CBC WITH DIFFERENTIAL/PLATELET
Absolute Monocytes: 998 cells/uL — ABNORMAL HIGH (ref 200–950)
Basophils Absolute: 74 cells/uL (ref 0–200)
Basophils Relative: 0.7 %
Eosinophils Absolute: 137 cells/uL (ref 15–500)
Eosinophils Relative: 1.3 %
HCT: 40.3 % (ref 38.5–50.0)
Hemoglobin: 14.1 g/dL (ref 13.2–17.1)
Lymphs Abs: 3791 cells/uL (ref 850–3900)
MCH: 35.3 pg — ABNORMAL HIGH (ref 27.0–33.0)
MCHC: 35 g/dL (ref 32.0–36.0)
MCV: 101 fL — ABNORMAL HIGH (ref 80.0–100.0)
MPV: 8.8 fL (ref 7.5–12.5)
Monocytes Relative: 9.5 %
Neutro Abs: 5502 cells/uL (ref 1500–7800)
Neutrophils Relative %: 52.4 %
Platelets: 325 10*3/uL (ref 140–400)
RBC: 3.99 10*6/uL — ABNORMAL LOW (ref 4.20–5.80)
RDW: 12.5 % (ref 11.0–15.0)
Total Lymphocyte: 36.1 %
WBC: 10.5 10*3/uL (ref 3.8–10.8)

## 2020-06-17 LAB — C-REACTIVE PROTEIN: CRP: 1.4 mg/L (ref <8.0)

## 2020-06-17 LAB — SEDIMENTATION RATE: Sed Rate: 19 mm/h (ref 0–20)

## 2020-06-27 ENCOUNTER — Encounter (HOSPITAL_COMMUNITY): Payer: Self-pay | Admitting: Emergency Medicine

## 2020-06-27 ENCOUNTER — Emergency Department (HOSPITAL_COMMUNITY): Payer: 59

## 2020-06-27 ENCOUNTER — Emergency Department (HOSPITAL_COMMUNITY)
Admission: EM | Admit: 2020-06-27 | Discharge: 2020-06-27 | Disposition: A | Discharge status: Home / Self Care | Payer: 59 | Attending: Emergency Medicine | Admitting: Emergency Medicine

## 2020-06-27 ENCOUNTER — Other Ambulatory Visit: Payer: Self-pay

## 2020-06-27 ENCOUNTER — Telehealth: Payer: Self-pay | Admitting: *Deleted

## 2020-06-27 DIAGNOSIS — R002 Palpitations: Secondary | ICD-10-CM | POA: Diagnosis not present

## 2020-06-27 DIAGNOSIS — Z79899 Other long term (current) drug therapy: Secondary | ICD-10-CM | POA: Insufficient documentation

## 2020-06-27 DIAGNOSIS — I10 Essential (primary) hypertension: Secondary | ICD-10-CM | POA: Diagnosis not present

## 2020-06-27 DIAGNOSIS — R079 Chest pain, unspecified: Secondary | ICD-10-CM

## 2020-06-27 DIAGNOSIS — F17210 Nicotine dependence, cigarettes, uncomplicated: Secondary | ICD-10-CM | POA: Diagnosis not present

## 2020-06-27 DIAGNOSIS — R0789 Other chest pain: Secondary | ICD-10-CM | POA: Insufficient documentation

## 2020-06-27 LAB — CBC
HCT: 36.6 % — ABNORMAL LOW (ref 39.0–52.0)
Hemoglobin: 12.4 g/dL — ABNORMAL LOW (ref 13.0–17.0)
MCH: 35.2 pg — ABNORMAL HIGH (ref 26.0–34.0)
MCHC: 33.9 g/dL (ref 30.0–36.0)
MCV: 104 fL — ABNORMAL HIGH (ref 80.0–100.0)
Platelets: 233 10*3/uL (ref 150–400)
RBC: 3.52 MIL/uL — ABNORMAL LOW (ref 4.22–5.81)
RDW: 12.5 % (ref 11.5–15.5)
WBC: 10 10*3/uL (ref 4.0–10.5)
nRBC: 0 % (ref 0.0–0.2)

## 2020-06-27 LAB — BASIC METABOLIC PANEL
Anion gap: 8 (ref 5–15)
BUN: 16 mg/dL (ref 6–20)
CO2: 24 mmol/L (ref 22–32)
Calcium: 9.5 mg/dL (ref 8.9–10.3)
Chloride: 103 mmol/L (ref 98–111)
Creatinine, Ser: 0.91 mg/dL (ref 0.61–1.24)
GFR, Estimated: >60 mL/min (ref >60)
Glucose, Bld: 97 mg/dL (ref 70–99)
Potassium: 3.8 mmol/L (ref 3.5–5.1)
Sodium: 135 mmol/L (ref 135–145)

## 2020-06-27 LAB — D-DIMER, QUANTITATIVE: D-Dimer, Quant: 0.29 ug/mL-FEU (ref 0.00–0.50)

## 2020-06-27 LAB — TROPONIN I (HIGH SENSITIVITY)
Troponin I (High Sensitivity): 6 ng/L (ref <18)
Troponin I (High Sensitivity): 4 ng/L (ref <18)

## 2020-06-27 NOTE — Telephone Encounter (Signed)
Patient called stating that he started with chest pain last night that it would come and go before he fell asleep. . Patient stated that the pain was sharp and would last a few seconds and then would return after about 10 seconds.  Patient stated that he was nauseated, but that is a little better this morning. Patent stated that he can feel his heart fluttering. Patient stated that his wife is a Engineer, civil (consulting) but he did not bother her with his symptoms last night. Patient stated that he took his blood pressure this morning before he called the  office and it was 161/124. Patient stated that he has not taken his blood pressure medications yet this morning. Patient stated that he usually eats something and then takes his medications. Patient was advised he should go ahead and take his blood pressure medication now. .Patient checked his blood pressure while on the phone and it was 167/97.  Patient denies SOB, difficulty breathing, tingling or numbness in left arm. Patient stated that his heart continues to flutter and he can feel his heart beating. After speaking to Nicki Reaper NP patient was advised that he should go to the ER to be evaluated. Patient stated that he has a friend that will take him to [facility] ER. Patient was advised if his symptoms get worse before going to the ER he should call 911 and patient verbalized understanding.

## 2020-06-27 NOTE — ED Provider Notes (Signed)
[facility] EMERGENCY DEPARTMENT Provider Note   CSN: [id number] Arrival date & time: 06/27/20  0912     History Chief Complaint  Patient presents with  . Chest Pain    Roy Patel is a 60 y.o. male.  Patient presents with chief complaint of palpitations and chest pain.  He had palpitation for about 10 days off and on.  Today's episode lasted several hours nearly the entire morning.  He had episodes of chest pain today as well that lasted 2 to 3 seconds left chest and then has resolved bleeding persistent palpitation sensation.  Denies fevers or cough.  No vomiting or diarrhea.  No shortness of breath described.          Past Medical History:  Diagnosis Date  . Cluster headache   . G6PD deficiency    ?  Marland Kitchen Hypertension   . Pleurisy     Patient Active Problem List   Diagnosis Date Noted  . Drug eruption 05/10/2020  . Post-operative infection 04/14/2020  . Pain and swelling of right shoulder 04/13/2020  . Preventative health care 09/29/2015  . G6PD deficiency 02/05/2015  . Essential hypertension 02/05/2015  . Hypertension   . Right shoulder pain 09/06/2010  . Chronic viral hepatitis C (HCC) 06/18/2008  . CIGARETTE SMOKER 05/27/2008  . CLUSTER HEADACHE 05/27/2008  . CARPAL TUNNEL SYNDROME 05/27/2008    Past Surgical History:  Procedure Laterality Date  . ANTERIOR CRUCIATE LIGAMENT REPAIR  2009   MCL removed ([facility])  . EXTENSOR TENDON OF FOREARM / WRIST REPAIR  ~1970   Left  . IRRIGATION AND DEBRIDEMENT SHOULDER Right 04/14/2020   Procedure: IRRIGATION AND DEBRIDEMENT SHOULDER;  Surgeon: Teryl Lucy, MD;  Location: [facility] OR;  Service: Orthopedics;  Laterality: Right;  . MENISCUS REPAIR  1997   [place], Right knee  . MENISCUS REPAIR  2000   [place], Left knee  . SHOULDER ARTHROSCOPY Right 04/14/2020   Procedure: ARTHROSCOPY SHOULDER with debridement;  Surgeon: Teryl Lucy, MD;  Location: [facility] OR;  Service: Orthopedics;  Laterality:  Right;       Family History  Problem Relation Age of Onset  . Cancer Mother        Lung  . Diabetes Father   . Heart disease Father        CHF  . Colon polyps Brother   . Colon polyps Brother   . Hypertension Neg Hx     Social History   Tobacco Use  . Smoking status: Current Every Day Smoker    Packs/day: 1.00    Types: Cigarettes  . Smokeless tobacco: Never Used  Vaping Use  . Vaping Use: Never used  Substance Use Topics  . Alcohol use: Yes    Alcohol/week: 2.0 standard drinks    Types: 2 Cans of beer per week    Comment: occasional  . Drug use: Never    Home Medications Prior to Admission medications   Medication Sig Start Date End Date Taking? Authorizing Provider  acetaminophen (TYLENOL) 325 MG tablet Take 1,000 mg by mouth every 6 (six) hours as needed for moderate pain.    [provider]  amLODipine (NORVASC) 5 MG tablet Take 1 tablet (5 mg total) by mouth daily. 05/10/20   [name], MD  amoxicillin (AMOXIL) 500 MG capsule Take 1 capsule (500 mg total) by mouth 3 (three) times daily. 05/12/20   Ginnie Smart, MD  baclofen (LIORESAL) 10 MG tablet Take 1 tablet (10 mg total)  by mouth 3 (three) times daily. As needed for muscle spasm 04/16/20   Janine Ores K, PA-C  diazepam (VALIUM) 2 MG tablet Take 1 tablet (2 mg total) by mouth every 12 (twelve) hours as needed for anxiety. Patient not taking: Reported on 06/16/2020 04/16/20 04/16/21  Janine Ores K, PA-C  gabapentin (NEURONTIN) 300 MG capsule Take 300 mg by mouth 3 (three) times daily.    [provider]  HYDROcodone-acetaminophen (NORCO/VICODIN) 5-325 MG tablet Take 1 tablet by mouth every 6 (six) hours as needed for moderate pain. Patient not taking: Reported on 06/16/2020    [provider]  lisinopril (ZESTRIL) 20 MG tablet Take 1 tablet (20 mg total) by mouth daily. 06/02/20   [name], MD  meloxicam (MOBIC) 15 MG tablet TAKE 1 TABLET BY MOUTH ONCE A DAY TAKE DAILY FOR  TWO WEEKS, AND THEN ONLY AS NEEDED AFTERWARDS. 06/07/20   [provider]  ondansetron (ZOFRAN) 4 MG tablet Take 1 tablet (4 mg total) by mouth every 8 (eight) hours as needed for nausea or vomiting. Patient not taking: Reported on 06/16/2020 04/16/20   Armida Sans, PA-C  oxyCODONE (OXY IR/ROXICODONE) 5 MG immediate release tablet Take 5 mg by mouth every 4 (four) hours as needed for severe pain.    [provider]  PROAIR HFA 108 (90 Base) MCG/ACT inhaler TAKE 2 PUFFS BY MOUTH EVERY 6 HOURS AS NEEDED FOR WHEEZE OR SHORTNESS OF BREATH Patient taking differently: Inhale 1 puff into the lungs every 4 (four) hours as needed for wheezing or shortness of breath. 06/11/18   [name], MD  promethazine (PHENERGAN) 25 MG tablet Take 1 tablet (25 mg total) by mouth every 6 (six) hours as needed for nausea or vomiting. Patient not taking: Reported on 06/16/2020 05/06/20   Gardiner Barefoot, MD  SUMAtriptan [facility]) 6 MG/0.5ML SOLN injection Inject 6 mg into the skin as needed (for cluster headaches).    [provider]    Allergies    Augmentin [amoxicillin-pot clavulanate], Chlorthalidone, Losartan, and Sulfa antibiotics  Review of Systems   Review of Systems  Constitutional: Negative for fever.  HENT: Negative for ear pain and sore throat.   Eyes: Negative for pain.  Respiratory: Negative for cough.   Cardiovascular: Positive for chest pain.  Gastrointestinal: Negative for abdominal pain.  Genitourinary: Negative for flank pain.  Musculoskeletal: Negative for back pain.  Skin: Negative for color change and rash.  Neurological: Negative for syncope.  All other systems reviewed and are negative.   Physical Exam Updated Vital Signs BP 137/79 (BP Location: Right Arm)   Pulse 94   Temp 98.1 F (36.7 C) (Oral)   Resp 17   Ht 5\' 9"  (1.753 m)   Wt 63.5 kg   SpO2 98%   BMI 20.67 kg/m   Physical Exam Constitutional:      General: He is not in acute distress.     Appearance: He is well-developed.  HENT:     Head: Normocephalic.     Nose: Nose normal.  Eyes:     Extraocular Movements: Extraocular movements intact.  Cardiovascular:     Rate and Rhythm: Normal rate.  Pulmonary:     Effort: Pulmonary effort is normal.  Musculoskeletal:     Right lower leg: No edema.     Left lower leg: No edema.  Skin:    Coloration: Skin is not jaundiced.  Neurological:     Mental Status: He is alert. Mental status  is at baseline.     ED Results / Procedures / Treatments   Labs (all labs ordered are listed, but only abnormal results are displayed) Labs Reviewed  CBC - Abnormal; Notable for the following components:      Result Value   RBC 3.52 (*)    Hemoglobin 12.4 (*)    HCT 36.6 (*)    MCV 104.0 (*)    MCH 35.2 (*)    All other components within normal limits  BASIC METABOLIC PANEL  D-DIMER, QUANTITATIVE  TROPONIN I (HIGH SENSITIVITY)  TROPONIN I (HIGH SENSITIVITY)    EKG EKG Interpretation  Date/Time:  Monday June 27 2020 09:22:58 EDT Ventricular Rate:  94 PR Interval:  138 QRS Duration: 82 QT Interval:  338 QTC Calculation: 422 R Axis:   67 Text Interpretation: Normal sinus rhythm Cannot rule out Anterior infarct , age undetermined Abnormal ECG Confirmed by Norman Clay ([id number]) on 06/27/2020 10:04:22 AM Also confirmed by Norman Clay ([id number]), editor Elita Quick [phone])  on 06/27/2020 11:49:53 AM   Radiology DG Chest 2 View  Result Date: 06/27/2020 CLINICAL DATA:  Chest pain. EXAM: CHEST - 2 VIEW COMPARISON:  April 15, 2020. FINDINGS: The heart size and mediastinal contours are within normal limits. Both lungs are clear. No pneumothorax or pleural effusion is noted. The visualized skeletal structures are unremarkable. IMPRESSION: No active cardiopulmonary disease. Electronically Signed   By: Lupita Raider M.D.   On: 06/27/2020 10:00    Procedures Procedures   Medications Ordered in ED Medications - No data to  display  ED Course  I have reviewed the triage vital signs and the nursing notes.  Pertinent labs & imaging results that were available during my care of the patient were reviewed by me and considered in my medical decision making (see chart for details).  Clinical Course as of 06/27/20 1436  Mon Jun 27, 2020  1115 DG Chest 2 View [JH]    Clinical Course User Index [JH] [place], Eustace Moore, MD   MDM Rules/Calculators/A&P                          Labs unremarkable troponin x2 -.  Patient still having sensation of palpitations, however EKG shows normal sinus rhythm during his episodes.  Etiology of his palpitation symptoms are unclear I doubt arrhythmia given work-up today.  No evidence of acute coronary syndrome today.  Will recommend outpatient follow-up with cardiology within the week.  Advising immediate return for worsening pain fevers or any additional concerns.  Final Clinical Impression(s) / ED Diagnoses Final diagnoses:  Palpitations  Left-sided chest pain    Rx / DC Orders ED Discharge Orders    None       Cheryll Cockayne, MD 06/27/20 1436

## 2020-06-27 NOTE — ED Triage Notes (Signed)
Patient coming from home, complaint of chest pressure and palpitations that started last night. Patient states the pain is intermittent. VSS. NAD.

## 2020-06-27 NOTE — Telephone Encounter (Signed)
He has been triaged in---- ~9AM (but nothing since). He may have left---please check on him later or in the morning

## 2020-06-27 NOTE — Discharge Instructions (Addendum)
Call your primary care doctor or specialist as discussed in the next 2-3 days.   Return immediately back to the ER if:  Your symptoms worsen within the next 12-24 hours. You develop new symptoms such as new fevers, persistent vomiting, new pain, shortness of breath, or new weakness or numbness, or if you have any other concerns.  

## 2020-06-29 NOTE — Telephone Encounter (Signed)
Spoke to pt. He said he still feels the same. "Weird feeling". Last week was 1st week back at work. Full cardiac work-up in ER was normal. He is scheduled here 07-08-20. Not scheduled to see cardiology until later April. He will go to the ER if symptoms worsen.

## 2020-07-08 ENCOUNTER — Ambulatory Visit (INDEPENDENT_AMBULATORY_CARE_PROVIDER_SITE_OTHER): Payer: 59 | Admitting: Internal Medicine

## 2020-07-08 ENCOUNTER — Encounter: Payer: Self-pay | Admitting: Internal Medicine

## 2020-07-08 ENCOUNTER — Other Ambulatory Visit: Payer: Self-pay

## 2020-07-08 VITALS — BP 138/72 | HR 59 | Temp 97.7°F | Ht 68.25 in | Wt 150.0 lb

## 2020-07-08 DIAGNOSIS — Z0000 Encounter for general adult medical examination without abnormal findings: Secondary | ICD-10-CM | POA: Diagnosis not present

## 2020-07-08 DIAGNOSIS — R002 Palpitations: Secondary | ICD-10-CM

## 2020-07-08 DIAGNOSIS — Z23 Encounter for immunization: Secondary | ICD-10-CM | POA: Diagnosis not present

## 2020-07-08 DIAGNOSIS — B182 Chronic viral hepatitis C: Secondary | ICD-10-CM

## 2020-07-08 DIAGNOSIS — Z1211 Encounter for screening for malignant neoplasm of colon: Secondary | ICD-10-CM

## 2020-07-08 DIAGNOSIS — Z125 Encounter for screening for malignant neoplasm of prostate: Secondary | ICD-10-CM

## 2020-07-08 DIAGNOSIS — I10 Essential (primary) hypertension: Secondary | ICD-10-CM

## 2020-07-08 DIAGNOSIS — F17200 Nicotine dependence, unspecified, uncomplicated: Secondary | ICD-10-CM

## 2020-07-08 LAB — T4, FREE: Free T4: 0.86 ng/dL (ref 0.60–1.60)

## 2020-07-08 LAB — PSA: PSA: 0.2 ng/mL (ref 0.10–4.00)

## 2020-07-08 NOTE — Assessment & Plan Note (Signed)
Not ready to quit.  

## 2020-07-08 NOTE — Assessment & Plan Note (Signed)
BP Readings from Last 3 Encounters:  07/08/20 138/72  06/27/20 137/79  06/16/20 130/85   Okay on lisinopril and amlodipine Recent labs okay

## 2020-07-08 NOTE — Assessment & Plan Note (Signed)
Still not recovered from shoulder infection Back to work No colon screening yet---will set up colonoscopy Discussed PSA--will check COVID booster--probably in fall with flu vaccine Consider shingrix

## 2020-07-08 NOTE — Assessment & Plan Note (Signed)
Will be getting treatment soon

## 2020-07-08 NOTE — Addendum Note (Signed)
Addended by: Eual Fines on: 07/08/2020 01:02 PM   Modules accepted: Orders

## 2020-07-08 NOTE — Progress Notes (Signed)
Subjective:    Patient ID: Roy Patel, male    DOB: 1961/01/10, 60 y.o.   MRN: [id number]  HPI Here for physical With wife This visit occurred during the SARS-CoV-2 public health emergency.  Safety protocols were in place, including screening questions prior to the visit, additional usage of staff PPE, and extensive cleaning of exam room while observing appropriate contact time as indicated for disinfecting solutions.   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 acetaminophen (TYLENOL) 325 MG tablet Take 1,000 mg by mouth every 6 (six) hours as needed for moderate pain.    Marland Kitchen amLODipine (NORVASC) 5 MG tablet Take 1 tablet (5 mg total) by mouth daily. 90 tablet 3  . amoxicillin (AMOXIL) 500 MG capsule Take 1 capsule (500 mg total) by mouth 3 (three) times daily. 90 capsule 3  . baclofen (LIORESAL) 10 MG tablet Take 1 tablet (10 mg total) by mouth 3 (three) times daily. As needed for muscle spasm 30 tablet 0  . diazepam (VALIUM) 2 MG tablet Take 1 tablet (2 mg total) by mouth every 12 (twelve) hours as needed for anxiety. 10 tablet 0  . gabapentin (NEURONTIN) 300 MG capsule Take 300 mg by mouth 3 (three) times daily.    Marland Kitchen lisinopril (ZESTRIL) 20 MG tablet Take 1 tablet (20 mg total) by mouth daily. 90 tablet 3  . meloxicam (MOBIC) 15 MG tablet TAKE 1 TABLET BY MOUTH ONCE A DAY TAKE DAILY FOR TWO WEEKS, AND THEN ONLY AS NEEDED AFTERWARDS.    Marland Kitchen PROAIR HFA 108 (90 Base) MCG/ACT inhaler TAKE 2 PUFFS BY MOUTH EVERY 6 HOURS AS NEEDED FOR  WHEEZE OR SHORTNESS OF BREATH (Patient taking differently: Inhale 1 puff into the lungs every 4 (four) hours as needed for wheezing or shortness of breath.) 8.5 Inhaler 2  . promethazine (PHENERGAN) 25 MG tablet Take 1 tablet (25 mg total) by mouth every 6 (six) hours as needed for nausea or vomiting. 20 tablet 1  . SUMAtriptan (IMITREX) 6 MG/0.5ML SOLN injection Inject 6 mg into the skin as needed (for cluster headaches).     No current facility-administered medications on file prior to visit.    Allergies  Allergen Reactions  . Augmentin [Amoxicillin-Pot Clavulanate] Nausea And Vomiting    "throws up violently"  . Chlorthalidone Other (See Comments)    Felt bad/lethargic  . Losartan Hives    Potential reaction to losartan  . Sulfa Antibiotics Other (See Comments)    Jaundice    Past Medical History:  Diagnosis Date  . Cluster headache   . G6PD deficiency    ?  Marland Kitchen Hypertension   . Pleurisy     Past Surgical History:  Procedure Laterality Date  . ANTERIOR CRUCIATE LIGAMENT REPAIR  2009   MCL removed ([facility])  . EXTENSOR TENDON OF FOREARM / WRIST REPAIR  ~1970   Left  . IRRIGATION AND DEBRIDEMENT SHOULDER Right 04/14/2020   Procedure: IRRIGATION AND DEBRIDEMENT SHOULDER;  Surgeon: Teryl Lucy, MD;  Location: [facility] OR;  Service: Orthopedics;  Laterality: Right;  . MENISCUS REPAIR  1997   [place], Right knee  . MENISCUS REPAIR  2000   [place], Left knee  . SHOULDER ARTHROSCOPY Right 04/14/2020   Procedure: ARTHROSCOPY SHOULDER with debridement;  Surgeon: Teryl Lucy, MD;  Location: [facility] OR;  Service: Orthopedics;  Laterality: Right;    Family History  Problem Relation Age of Onset  . Cancer Mother        Lung  . Diabetes Father   . Heart disease Father        CHF  . Colon polyps Brother   . Colon polyps Brother   . Hypertension Neg Hx     Social History   Socioeconomic History  . Marital status: Married    Spouse name: Not on file  . Number of children: 1   . Years of education: Not on file  . Highest education level: Not on file  Occupational History  . Occupation: Health visitor at [employer]  . Smoking status: Current Every Day Smoker    Packs/day: 1.00    Types: Cigarettes  . Smokeless tobacco: Never Used  Vaping Use  . Vaping Use: Never used  Substance and Sexual Activity  . Alcohol use: Yes    Alcohol/week: 2.0 standard drinks    Types: 2 Cans of beer per week    Comment: occasional  . Drug use: Never  . Sexual activity: Not on file  Other Topics Concern  . Not on file  Social History Narrative   Married:  2nd   Son in [place]   Step daughter here.   Social Determinants of Health   Financial Resource Strain: Not on file  Food Insecurity: Not on file  Transportation Needs: Not on file  Physical Activity: Not on file  Stress: Not on file  Social Connections: Not on file  Intimate Partner Violence: Not on file   Review of Systems  Constitutional: Positive for fatigue.       Lost 18# with illness--has gained some back Wears seat belt  HENT: Negative for trouble swallowing.        Rare tinnitus ?mild hearing loss Terrible teeth----hasn't seen dentist lately  Eyes: Negative for visual disturbance.       No diplopia or unilateral vision loss  Respiratory: Positive for cough. Negative for shortness of breath and wheezing.   Cardiovascular: Positive for palpitations. Negative for leg swelling.       Has cut back on caffeine  Gastrointestinal: Negative for blood in stool and constipation.       Some heartburn--no meds  Endocrine: Negative for polydipsia and polyuria.  Genitourinary: Negative for difficulty urinating and urgency.       No sexual problems  Musculoskeletal: Positive for arthralgias and back pain. Negative for joint swelling.  Skin:       No suspicious lesions Scattered red areas since surgery  Allergic/Immunologic: Positive for environmental allergies. Negative for immunocompromised  state.       No meds for this  Neurological: Positive for dizziness. Negative for syncope and headaches.  Hematological: Negative for adenopathy. Does not bruise/bleed easily.  Psychiatric/Behavioral: Negative for dysphoric mood. The patient is nervous/anxious.        Sleep is variable--in phases        Objective:   Physical Exam Constitutional:      Appearance: Normal appearance.  HENT:     Right Ear: Tympanic membrane, ear canal and external ear normal.     Left Ear: Tympanic  membrane, ear canal and external ear normal.     Mouth/Throat:     Pharynx: No oropharyngeal exudate or posterior oropharyngeal erythema.  Eyes:     Conjunctiva/sclera: Conjunctivae normal.     Pupils: Pupils are equal, round, and reactive to light.  Cardiovascular:     Rate and Rhythm: Normal rate and regular rhythm.     Pulses: Normal pulses.     Heart sounds: No murmur heard. No gallop.   Pulmonary:     Effort: Pulmonary effort is normal.     Breath sounds: Normal breath sounds. No wheezing or rales.  Abdominal:     Palpations: Abdomen is soft.     Tenderness: There is no abdominal tenderness.  Musculoskeletal:     Cervical back: Neck supple.     Right lower leg: No edema.     Left lower leg: No edema.  Lymphadenopathy:     Cervical: No cervical adenopathy.  Skin:    General: Skin is warm.     Findings: No rash.  Neurological:     General: No focal deficit present.     Mental Status: He is alert and oriented to person, place, and time.  Psychiatric:     Comments: Clearly some anxiety---hand movements, etc            Assessment & Plan:

## 2020-07-08 NOTE — Assessment & Plan Note (Signed)
Suspicious for SVT Going to cardiology Will check thyroid

## 2020-07-25 ENCOUNTER — Encounter: Payer: Self-pay | Admitting: Family Medicine

## 2020-07-25 ENCOUNTER — Ambulatory Visit: Payer: 59 | Admitting: Family Medicine

## 2020-07-25 ENCOUNTER — Other Ambulatory Visit: Payer: Self-pay

## 2020-07-25 VITALS — BP 160/80 | HR 105 | Temp 98.5°F | Ht 68.25 in | Wt 153.2 lb

## 2020-07-25 DIAGNOSIS — M7989 Other specified soft tissue disorders: Secondary | ICD-10-CM | POA: Diagnosis not present

## 2020-07-25 DIAGNOSIS — B353 Tinea pedis: Secondary | ICD-10-CM

## 2020-07-25 DIAGNOSIS — Z1211 Encounter for screening for malignant neoplasm of colon: Secondary | ICD-10-CM

## 2020-07-25 NOTE — Progress Notes (Signed)
Roy Southwell T. Dalbert Stillings, MD, CAQ Sports Medicine  Primary Care and Sports Medicine [facility] at [facility] [address] [place], [address]  Phone: [phone]  FAX: [phone]  Roy Patel - 60 y.o. male  MRN [id number]  Date of Birth: 11-17-1960  Date: 07/25/2020  PCP: [name], MD  Referral: [name], MD  Chief Complaint  Patient presents with  . Leg Swelling  . Foot Swelling    This visit occurred during the SARS-CoV-2 public health emergency.  Safety protocols were in place, including screening questions prior to the visit, additional usage of staff PPE, and extensive cleaning of exam room while observing appropriate contact time as indicated for disinfecting solutions.   Subjective:   Roy Patel is a 60 y.o. very pleasant male patient with Body mass index is 23.13 kg/m. who presents with the following:  Taking AMOX at baseline right now for post-op wound infection. See ID notes for detail.  Operative notes, discharge summary, and initial ID consultation have a broken glass block.    Later infectious disease notes are available.  On chart review, the patient had a rotator cuff repair and several weeks later he has severe pain and swelling.  Ultimately found to have bacteria on culture after aspiration with ?  Debridement.  He was discharged on vancomycin and ceftriaxone and infectious disease transitioned him to amoxicillin, he is still on this.  He presents with me with primary issue of some swelling in his lower extremities over the weekend, and this is entirely resolved at this point.  Normally he is very active and works at [employer] daily with a great amount of walking and physical exercise.  This was new, and he generally does not have any swelling.  He also wanted me to look at some rash on his feet and to a lesser extent on his hands.   This AM feet felt cold - did not warm up until  lunch.  Review of Systems is noted in the HPI, as appropriate   Objective:   BP (!) 160/80   Pulse (!) 105   Temp 98.5 F (36.9 C) (Temporal)   Ht 5' 8.25" (1.734 m)   Wt 153 lb 4 oz (69.5 kg)   SpO2 96%   BMI 23.13 kg/m   GEN: No acute distress; alert,appropriate. PULM: Breathing comfortably in no respiratory distress PSYCH: Normally interactive.    Lower extremities: The patient does not appear to have any edema at this point throughout the entirety of the lower extremity and foot.  No clubbing or cyanosis. DP and PT pulses are patent bilaterally.  Derm: There is a reddish, scaly rash between his toes and on the forefoot.  There is also some discoloration in the hindfoot near the plantar aspect of the foot which appears less scaly.  Radiology:   Assessment and Plan:     ICD-10-CM   1. Foot swelling  M79.89   2. Screen for colon cancer  Z12.11 Ambulatory referral to Gastroenterology  3. Tinea pedis of both feet  B35.3    The patient is feet and lower extremities are not swelling now.  Pulses are good.  He does have a recently added Norvasc to his blood pressure regimen, and this does often cause some swelling.  If it persists, then changing blood pressure medicine would be reasonable.  As I look at his feet, to me it looks like he has some  athlete's foot at least in the forefoot and between his toes.  He is going to get some over-the-counter athlete's foot cream.  He has had this a number of times in the past.  This may not explain the color change that he is appreciated in his feet and hands.   Orders Placed This Encounter  Procedures  . Ambulatory referral to Gastroenterology    Follow-up: No follow-ups on file.  Signed,  Elpidio Galea. Taliyah Watrous, MD   Outpatient Encounter Medications as of 07/25/2020  Medication Sig  . acetaminophen (TYLENOL) 325 MG tablet Take 1,000 mg by mouth every 6 (six) hours as needed for moderate pain.  Marland Kitchen amLODipine (NORVASC) 5 MG tablet  Take 1 tablet (5 mg total) by mouth daily.  Marland Kitchen amoxicillin (AMOXIL) 500 MG capsule Take 1 capsule (500 mg total) by mouth 3 (three) times daily.  . baclofen (LIORESAL) 10 MG tablet Take 1 tablet (10 mg total) by mouth 3 (three) times daily. As needed for muscle spasm  . diazepam (VALIUM) 2 MG tablet Take 1 tablet (2 mg total) by mouth every 12 (twelve) hours as needed for anxiety.  . gabapentin (NEURONTIN) 300 MG capsule Take 300 mg by mouth 3 (three) times daily.  Marland Kitchen lisinopril (ZESTRIL) 20 MG tablet Take 1 tablet (20 mg total) by mouth daily.  . meloxicam (MOBIC) 15 MG tablet TAKE 1 TABLET BY MOUTH ONCE A DAY TAKE DAILY FOR TWO WEEKS, AND THEN ONLY AS NEEDED AFTERWARDS.  Marland Kitchen PROAIR HFA 108 (90 Base) MCG/ACT inhaler TAKE 2 PUFFS BY MOUTH EVERY 6 HOURS AS NEEDED FOR WHEEZE OR SHORTNESS OF BREATH (Patient taking differently: Inhale 1 puff into the lungs every 4 (four) hours as needed for wheezing or shortness of breath.)  . promethazine (PHENERGAN) 25 MG tablet Take 1 tablet (25 mg total) by mouth every 6 (six) hours as needed for nausea or vomiting.  . SUMAtriptan (IMITREX) 6 MG/0.5ML SOLN injection Inject 6 mg into the skin as needed (for cluster headaches).   No facility-administered encounter medications on file as of 07/25/2020.

## 2020-07-28 ENCOUNTER — Ambulatory Visit: Payer: 59 | Admitting: Infectious Diseases

## 2020-08-07 NOTE — Progress Notes (Signed)
Cardiology Office Note:    Date:  08/08/2020   ID:  SUTTER Roy Patel, DOB 1960-11-26, MRN [id number]  PCP:  Roy Schwalbe, MD  Cardiologist:  No primary care provider on file.   Referring MD: Roy Schwalbe, MD   Chief Complaint  Patient presents with  . Chest Pain    History of Present Illness:    Roy Patel is a 60 y.o. male with a hx of palpitations and chest pain with negative ER w/u by Dr. Norman Patel  6 weeks ago he had an emergency room visit because of palpitations and chest pain.  This was a combination of a 31-month saga with a right rotator cuff repair that resulted in deep-seated infection and the need for long-term antibiotic therapy.  He describes the chest situation as irregular palpitations and associated sharp atypical episodes of pain lasting seconds.  The pain would resolve within seconds.  Work-up in the emergency room was unremarkable.  It has not recurred.  He is back to full activity.  He smokes cigarettes.  There is no family history of CAD.  He has all antibiotic therapy for chronic infection of the right shoulder.  He has essential hypertension and is on amlodipine and Zestril therapy for control.  He is not diabetic.  Past Medical History:  Diagnosis Date  . Cluster headache   . G6PD deficiency    ?  Roy Patel Hypertension   . Pleurisy     Past Surgical History:  Procedure Laterality Date  . ANTERIOR CRUCIATE LIGAMENT REPAIR  2009   MCL removed ([facility])  . EXTENSOR TENDON OF FOREARM / WRIST REPAIR  ~1970   Left  . IRRIGATION AND DEBRIDEMENT SHOULDER Right 04/14/2020   Procedure: IRRIGATION AND DEBRIDEMENT SHOULDER;  Surgeon: Teryl Lucy, MD;  Location: [facility] OR;  Service: Orthopedics;  Laterality: Right;  . MENISCUS REPAIR  1997   [place], Right knee  . MENISCUS REPAIR  2000   [place], Left knee  . SHOULDER ARTHROSCOPY Right 04/14/2020   Procedure: ARTHROSCOPY SHOULDER with debridement;  Surgeon: Teryl Lucy, MD;  Location: [facility] OR;  Service:  Orthopedics;  Laterality: Right;    Current Medications: Current Meds  Medication Sig  . acetaminophen (TYLENOL) 325 MG tablet Take 1,000 mg by mouth every 6 (six) hours as needed for moderate pain.  Roy Patel amLODipine (NORVASC) 5 MG tablet Take 1 tablet (5 mg total) by mouth daily.  Roy Patel amoxicillin (AMOXIL) 500 MG capsule Take 1 capsule (500 mg total) by mouth 3 (three) times daily.  . baclofen (LIORESAL) 10 MG tablet Take 1 tablet (10 mg total) by mouth 3 (three) times daily. As needed for muscle spasm  . diazepam (VALIUM) 2 MG tablet Take 1 tablet (2 mg total) by mouth every 12 (twelve) hours as needed for anxiety.  . gabapentin (NEURONTIN) 300 MG capsule Take 300 mg by mouth 3 (three) times daily.  Roy Patel lisinopril (ZESTRIL) 20 MG tablet Take 1 tablet (20 mg total) by mouth daily.  . meloxicam (MOBIC) 15 MG tablet TAKE 1 TABLET BY MOUTH ONCE A DAY TAKE DAILY FOR TWO WEEKS, AND THEN ONLY AS NEEDED AFTERWARDS.  Roy Patel PROAIR HFA 108 (90 Base) MCG/ACT inhaler TAKE 2 PUFFS BY MOUTH EVERY 6 HOURS AS NEEDED FOR WHEEZE OR SHORTNESS OF BREATH (Patient taking differently: Inhale 1 puff into the lungs every 4 (four) hours as needed for wheezing or shortness of breath.)  . promethazine (PHENERGAN) 25 MG tablet Take 1 tablet (25 mg total) by  mouth every 6 (six) hours as needed for nausea or vomiting.  . SUMAtriptan (IMITREX) 6 MG/0.5ML SOLN injection Inject 6 mg into the skin as needed (for cluster headaches).     Allergies:   Augmentin [amoxicillin-pot clavulanate], Chlorthalidone, Losartan, and Sulfa antibiotics   Social History   Socioeconomic History  . Marital status: Married    Spouse name: Not on file  . Number of children: 1  . Years of education: Not on file  . Highest education level: Not on file  Occupational History  . Occupation: Health visitor at [employer]  . Smoking status: Current Every Day Smoker    Packs/day: 1.00    Types: Cigarettes  . Smokeless tobacco: Never Used   Vaping Use  . Vaping Use: Never used  Substance and Sexual Activity  . Alcohol use: Yes    Alcohol/week: 2.0 standard drinks    Types: 2 Cans of beer per week    Comment: occasional  . Drug use: Never  . Sexual activity: Not on file  Other Topics Concern  . Not on file  Social History Narrative   Married:  2nd   Son in [place]   Step daughter here.   Social Determinants of Health   Financial Resource Strain: Not on file  Food Insecurity: Not on file  Transportation Needs: Not on file  Physical Activity: Not on file  Stress: Not on file  Social Connections: Not on file     Family History: The patient's family history includes Cancer in his mother; Colon polyps in his brother and brother; Diabetes in his father; Heart disease in his father. There is no history of Hypertension.  ROS:   Please see the history of present illness.    Still has pain in his right shoulder although slowly improving.  All other systems reviewed and are negative.  EKGs/Labs/Other Studies Reviewed:    The following studies were reviewed today: No cardiac evaluation however  EKG:  EKG performed 06/28/2020 demonstrates poor R wave progression V1 through V3, prominent T waves with peaking.  ST elevation consistent with early repolarization.  Recent Labs: 06/16/2020: ALT 364 06/27/2020: BUN 16; Creatinine, Ser 0.91; Hemoglobin 12.4; Platelets 233; Potassium 3.8; Sodium 135  Recent Lipid Panel    Component Value Date/Time   CHOL 144 09/29/2015 1700   TRIG 223.0 (H) 09/29/2015 1700   HDL 52.90 09/29/2015 1700   CHOLHDL 3 09/29/2015 1700   VLDL 44.6 (H) 09/29/2015 1700   LDLDIRECT 55.0 09/29/2015 1700    Physical Exam:    VS:  BP 116/68   Pulse 96   Ht 5\' 9"  (1.753 m)   Wt 152 lb (68.9 kg)   SpO2 98%   BMI 22.45 kg/m     Wt Readings from Last 3 Encounters:  08/08/20 152 lb (68.9 kg)  07/25/20 153 lb 4 oz (69.5 kg)  07/08/20 150 lb (68 kg)     GEN: Slender. No acute distress HEENT:  Normal NECK: No JVD. LYMPHATICS: No lymphadenopathy CARDIAC: No murmur. RRR no gallop, or edema. VASCULAR:  Normal Pulses. No bruits. RESPIRATORY:  Clear to auscultation without rales, wheezing or rhonchi  ABDOMEN: Soft, non-tender, non-distended, No pulsatile mass, MUSCULOSKELETAL: No deformity  SKIN: Warm and dry NEUROLOGIC:  Alert and oriented x 3 PSYCHIATRIC:  Normal affect   ASSESSMENT:    1. Palpitations   2. Chest pain of uncertain etiology   3. Essential hypertension   4. CIGARETTE SMOKER    PLAN:  In order of problems listed above:  1. Uncertain etiology.  Wife is a Engineer, civil (consulting) and states that during his time in the emergency room on the monitor, she felt he was having PACs. 2. Chest pain was atypical.  Has not recurred. 3. Hypertension, currently controlled. 4. Tobacco abuse.   Medication Adjustments/Labs and Tests Ordered: Current medicines are reviewed at length with the patient today.  Concerns regarding medicines are outlined above.  Orders Placed This Encounter  Procedures  . CT CARDIAC SCORING (SELF PAY ONLY)   No orders of the defined types were placed in this encounter.   There are no Patient Instructions on file for this visit.   Signed, Lesleigh Noe, MD  08/08/2020 3:15 PM    [facility]

## 2020-08-08 ENCOUNTER — Encounter: Payer: Self-pay | Admitting: Interventional Cardiology

## 2020-08-08 ENCOUNTER — Ambulatory Visit: Payer: 59 | Admitting: Interventional Cardiology

## 2020-08-08 ENCOUNTER — Other Ambulatory Visit: Payer: Self-pay

## 2020-08-08 VITALS — BP 116/68 | HR 96 | Ht 69.0 in | Wt 152.0 lb

## 2020-08-08 DIAGNOSIS — I10 Essential (primary) hypertension: Secondary | ICD-10-CM

## 2020-08-08 DIAGNOSIS — F17200 Nicotine dependence, unspecified, uncomplicated: Secondary | ICD-10-CM

## 2020-08-08 DIAGNOSIS — R079 Chest pain, unspecified: Secondary | ICD-10-CM | POA: Diagnosis not present

## 2020-08-08 DIAGNOSIS — R002 Palpitations: Secondary | ICD-10-CM

## 2020-08-08 NOTE — Patient Instructions (Signed)
Medication Instructions:  Your physician recommends that you continue on your current medications as directed. Please refer to the Current Medication list given to you today.  *If you need a refill on your cardiac medications before your next appointment, please call your pharmacy*   Lab Work: none If you have labs (blood work) drawn today and your tests are completely normal, you will receive your results only by: Marland Kitchen MyChart Message (if you have MyChart) OR . A paper copy in the mail If you have any lab test that is abnormal or we need to change your treatment, we will call you to review the results.   Testing/Procedures: Your physician recommends you have a calcium score performed.   Follow-Up: At [facility], you and your health needs are our priority.  As part of our continuing mission to provide you with exceptional heart care, we have created designated Provider Care Teams.  These Care Teams include your primary Cardiologist (physician) and Advanced Practice Providers (APPs -  Physician Assistants and Nurse Practitioners) who all work together to provide you with the care you need, when you need it.  We recommend signing up for the patient portal called "MyChart".  Sign up information is provided on this After Visit Summary.  MyChart is used to connect with patients for Virtual Visits (Telemedicine).  Patients are able to view lab/test results, encounter notes, upcoming appointments, etc.  Non-urgent messages can be sent to your provider as well.   To learn more about what you can do with MyChart, go to [email or web address].    Your next appointment:   As needed  The format for your next appointment:   In Person  Provider:   You may see Dr. Verdis Prime or one of the following Advanced Practice Providers on your designated Care Team:    Georgie Chard, NP   Other Instructions

## 2020-08-11 ENCOUNTER — Ambulatory Visit: Payer: 59 | Admitting: Infectious Diseases

## 2020-08-30 ENCOUNTER — Encounter: Payer: Self-pay | Admitting: Infectious Diseases

## 2020-08-30 ENCOUNTER — Other Ambulatory Visit: Payer: Self-pay

## 2020-08-30 ENCOUNTER — Ambulatory Visit: Payer: 59 | Admitting: Infectious Diseases

## 2020-08-30 DIAGNOSIS — I10 Essential (primary) hypertension: Secondary | ICD-10-CM | POA: Diagnosis not present

## 2020-08-30 DIAGNOSIS — T8140XD Infection following a procedure, unspecified, subsequent encounter: Secondary | ICD-10-CM

## 2020-08-30 DIAGNOSIS — F17200 Nicotine dependence, unspecified, uncomplicated: Secondary | ICD-10-CM

## 2020-08-30 DIAGNOSIS — B182 Chronic viral hepatitis C: Secondary | ICD-10-CM | POA: Diagnosis not present

## 2020-08-30 DIAGNOSIS — L270 Generalized skin eruption due to drugs and medicaments taken internally: Secondary | ICD-10-CM | POA: Diagnosis not present

## 2020-08-30 NOTE — Assessment & Plan Note (Signed)
Suspect sun reaction to lisinopril

## 2020-08-30 NOTE — Assessment & Plan Note (Signed)
We discussed this and he wishes to continue for 1 year on anbx Will recheck his ESR and CRP today Will plan on seeing him back in 4 months, closing in on 1 year of rx.

## 2020-08-30 NOTE — Assessment & Plan Note (Signed)
Encouraged him to quit. He promises to do this in the next 6 months.

## 2020-08-30 NOTE — Assessment & Plan Note (Signed)
He has f/u with CV He is asx.

## 2020-08-30 NOTE — Progress Notes (Signed)
Subjective:    Patient ID: Roy Patel, male    DOB: 14-Nov-1960, 60 y.o.   MRN: [id number]  HPI Roy Patel Roy Patel a 60 y.o.malewith hx of R rotator cuff surgery, 3 tendon repairs and bone spurs removed 03-17-20 (outpt). By 12-25, pain and swelling resolved. He then developed "masssive pain" and swelling. He came to Dr Roy Patel office 12-29 and had joint aspiration which was felt to be consistent with infection. He had I & D 12-30and his Cx grew P acnes, Staph haemolyticus (R-ox), Staph hominis (pan-sens). He was d/c home on vanco/ceftriaxone on 04-16-20.  He has since had issues with rash due to either vanco/rocephin/losarten from his Roy Patel and it was removed 05-12-20. (28 days of therapy). At his f/u 05-12-20 he was changed to po amoxil for 3 months.  At his f/u 06-16-20, he wished to continue his po amoxil.   1 piece of hardware removed, 1 remains.  He was also found to be Hep C+ (1a, 27.7 million viral load).                06-16-20 1-10     1-3 ESR     19  37        36 CRP    1.$R'4  2          18  'fs$ Today he does not feel like his shoulder is any better- has limitation of reaching forward and back. Has not been assigned to PT, he does have PT exercises to do at home.  No swelling.  No problems with amoxil. "Doesn't like it"  He is back at work.  Wife is worried he had photosensitive rash (he is not clear which rx did this- lisinopril (vs amoxil, amlodipine).   Since last visit he was seen by CV for CP. He has cardiac CT pending.   Review of Systems  Constitutional: Negative for chills, fever and unexpected weight change.  Respiratory: Negative for shortness of breath.   Cardiovascular: Negative for chest pain.  Gastrointestinal: Negative for constipation and diarrhea.  Genitourinary: Negative for difficulty urinating.  Musculoskeletal: Positive for arthralgias.  Neurological: Negative for headaches.       Objective:   Physical Exam Vitals reviewed.  Constitutional:       Appearance: Normal appearance.  HENT:     Mouth/Throat:     Mouth: Mucous membranes are moist.     Pharynx: No oropharyngeal exudate.  Eyes:     Extraocular Movements: Extraocular movements intact.     Pupils: Pupils are equal, round, and reactive to light.  Cardiovascular:     Rate and Rhythm: Normal rate and regular rhythm.  Pulmonary:     Effort: Pulmonary effort is normal.     Breath sounds: Normal breath sounds.  Abdominal:     General: Bowel sounds are normal. There is no distension.     Palpations: Abdomen is soft.     Tenderness: There is no abdominal tenderness.  Musculoskeletal:        General: Normal range of motion.       Arms:     Cervical back: Normal range of motion and neck supple.     Right lower leg: No edema.     Left lower leg: No edema.  Neurological:     Mental Status: He is alert.  Psychiatric:        Mood and Affect: Mood normal.           Assessment & Plan:

## 2020-08-30 NOTE — Assessment & Plan Note (Signed)
He defers to start this rx after he completes his amoxil.

## 2020-08-31 LAB — C-REACTIVE PROTEIN: CRP: 4.6 mg/L (ref <8.0)

## 2020-08-31 LAB — SEDIMENTATION RATE: Sed Rate: 11 mm/h (ref 0–20)

## 2020-09-12 ENCOUNTER — Other Ambulatory Visit: Payer: Self-pay | Admitting: Infectious Diseases

## 2020-09-12 DIAGNOSIS — T8140XD Infection following a procedure, unspecified, subsequent encounter: Secondary | ICD-10-CM

## 2020-09-13 ENCOUNTER — Inpatient Hospital Stay: Admission: RE | Admit: 2020-09-13 | Payer: Self-pay | Source: Ambulatory Visit

## 2020-10-10 ENCOUNTER — Other Ambulatory Visit: Payer: Self-pay | Admitting: *Deleted

## 2020-10-10 ENCOUNTER — Other Ambulatory Visit: Payer: Self-pay

## 2020-10-10 ENCOUNTER — Ambulatory Visit (INDEPENDENT_AMBULATORY_CARE_PROVIDER_SITE_OTHER)
Admission: RE | Admit: 2020-10-10 | Discharge: 2020-10-10 | Disposition: A | Discharge status: Home / Self Care | Payer: Self-pay | Source: Ambulatory Visit | Attending: Interventional Cardiology | Admitting: Interventional Cardiology

## 2020-10-10 DIAGNOSIS — I10 Essential (primary) hypertension: Secondary | ICD-10-CM

## 2020-10-10 DIAGNOSIS — R931 Abnormal findings on diagnostic imaging of heart and coronary circulation: Secondary | ICD-10-CM

## 2020-10-11 ENCOUNTER — Other Ambulatory Visit: Payer: Self-pay

## 2020-10-11 ENCOUNTER — Ambulatory Visit (INDEPENDENT_AMBULATORY_CARE_PROVIDER_SITE_OTHER): Payer: 59

## 2020-10-11 DIAGNOSIS — Z23 Encounter for immunization: Secondary | ICD-10-CM

## 2020-10-11 NOTE — Progress Notes (Signed)
Per orders of Dr. Bedsole, in Dr. Letvaks absence, 2nd injection of shingrix given by Kaulin Chaves G Ramez Arrona. ?Patient tolerated injection well.  ?

## 2020-10-20 ENCOUNTER — Other Ambulatory Visit: Payer: Self-pay

## 2020-10-20 ENCOUNTER — Other Ambulatory Visit: Payer: 59

## 2020-10-20 DIAGNOSIS — R931 Abnormal findings on diagnostic imaging of heart and coronary circulation: Secondary | ICD-10-CM

## 2020-10-20 DIAGNOSIS — I10 Essential (primary) hypertension: Secondary | ICD-10-CM

## 2020-10-20 LAB — LIPID PANEL
Chol/HDL Ratio: 2.3 ratio (ref 0.0–5.0)
Cholesterol, Total: 115 mg/dL (ref 100–199)
HDL: 51 mg/dL (ref >39)
LDL Chol Calc (NIH): 42 mg/dL (ref 0–99)
Triglycerides: 127 mg/dL (ref 0–149)
VLDL Cholesterol Cal: 22 mg/dL (ref 5–40)

## 2020-10-28 ENCOUNTER — Other Ambulatory Visit: Payer: Self-pay | Admitting: *Deleted

## 2020-10-28 MED ORDER — ICOSAPENT ETHYL 1 G PO CAPS: 2.0000 g | ORAL_CAPSULE | Freq: Two times a day (BID) | ORAL | 11 refills | Status: DC

## 2021-01-05 ENCOUNTER — Ambulatory Visit: Payer: 59 | Admitting: Infectious Diseases

## 2021-03-06 ENCOUNTER — Other Ambulatory Visit: Payer: Self-pay | Admitting: Infectious Diseases

## 2021-03-06 DIAGNOSIS — T8140XD Infection following a procedure, unspecified, subsequent encounter: Secondary | ICD-10-CM

## 2021-05-05 ENCOUNTER — Other Ambulatory Visit: Payer: Self-pay | Admitting: Internal Medicine

## 2021-07-05 ENCOUNTER — Other Ambulatory Visit: Payer: Self-pay | Admitting: Internal Medicine

## 2021-07-10 ENCOUNTER — Encounter: Payer: 59 | Admitting: Internal Medicine

## 2021-09-25 ENCOUNTER — Encounter: Payer: Self-pay | Admitting: Infectious Diseases

## 2021-11-14 ENCOUNTER — Encounter: Payer: Self-pay | Admitting: Family

## 2021-11-14 ENCOUNTER — Ambulatory Visit: Payer: 59 | Admitting: Family

## 2021-11-14 VITALS — BP 150/82 | HR 84 | Temp 98.2°F | Resp 16 | Ht 69.0 in | Wt 147.5 lb

## 2021-11-14 DIAGNOSIS — L089 Local infection of the skin and subcutaneous tissue, unspecified: Secondary | ICD-10-CM | POA: Diagnosis not present

## 2021-11-14 DIAGNOSIS — W57XXXA Bitten or stung by nonvenomous insect and other nonvenomous arthropods, initial encounter: Secondary | ICD-10-CM

## 2021-11-14 DIAGNOSIS — S60460A Insect bite (nonvenomous) of right index finger, initial encounter: Secondary | ICD-10-CM

## 2021-11-14 MED ORDER — DOXYCYCLINE HYCLATE 100 MG PO TABS: 100.0000 mg | ORAL_TABLET | Freq: Two times a day (BID) | ORAL | 0 refills | Status: AC

## 2021-11-14 NOTE — Assessment & Plan Note (Signed)
RX doxycycline 100 mg po bid x 10 days Wound culture ordered pending results Watch for worsening s/s infection

## 2021-11-14 NOTE — Patient Instructions (Signed)
RX doxycycline 100 mg po bid x 10 days  Due to recent changes in healthcare laws, you may see results of your imaging and/or laboratory studies on MyChart before I have had a chance to review them.  I understand that in some cases there may be results that are confusing or concerning to you. Please understand that not all results are received at the same time and often I may need to interpret multiple results in order to provide you with the best plan of care or course of treatment. Therefore, I ask that you please give me 2 business days to thoroughly review all your results before contacting my office for clarification. Should we see a critical lab result, you will be contacted sooner.   It was a pleasure seeing you today! Please do not hesitate to reach out with any questions and or concerns.  Regards,   Mort Sawyers FNP-C

## 2021-11-14 NOTE — Progress Notes (Signed)
Established Patient Office Visit  Subjective:  Patient ID: Roy Patel, male    DOB: 1960-05-25  Age: 61 y.o. MRN: [id number]  CC:  Chief Complaint  Patient presents with   Insect Bite    X 3 days    HPI Roy Patel is here today with concerns.   Thinks was bit by a spider three days ago.  Works at [employer] course was digging in the golf cups With redness and and had a blister that just busted today.  With reddish tinged discharge when it busted.   No fever or chills.   Past Medical History:  Diagnosis Date   Cluster headache    G6PD deficiency    ?   Hypertension    Pleurisy     Past Surgical History:  Procedure Laterality Date   ANTERIOR CRUCIATE LIGAMENT REPAIR  2009   MCL removed ([facility])   EXTENSOR TENDON OF FOREARM / WRIST REPAIR  ~1970   Left   IRRIGATION AND DEBRIDEMENT SHOULDER Right 04/14/2020   Procedure: IRRIGATION AND DEBRIDEMENT SHOULDER;  Surgeon: Teryl Lucy, MD;  Location: [facility] OR;  Service: Orthopedics;  Laterality: Right;   MENISCUS REPAIR  1997   [place], Right knee   MENISCUS REPAIR  2000   [place], Left knee   SHOULDER ARTHROSCOPY Right 04/14/2020   Procedure: ARTHROSCOPY SHOULDER with debridement;  Surgeon: Teryl Lucy, MD;  Location: [facility];  Service: Orthopedics;  Laterality: Right;    Family History  Problem Relation Age of Onset   Cancer Mother        Lung   Diabetes Father    Heart disease Father        CHF   Colon polyps Brother    Colon polyps Brother    Hypertension Neg Hx     Social History   Socioeconomic History   Marital status: Married    Spouse name: Not on file   Number of children: 1   Years of education: Not on file   Highest education level: Not on file  Occupational History   Occupation: Golf Maintenance at [employer]  Tobacco Use   Smoking status: Every Day    Packs/day: 1.00    Types: Cigarettes   Smokeless tobacco: Never  Vaping Use   Vaping Use: Never used  Substance and Sexual Activity    Alcohol use: Yes    Alcohol/week: 2.0 standard drinks of alcohol    Types: 2 Cans of beer per week    Comment: occasional   Drug use: Never   Sexual activity: Not on file  Other Topics Concern   Not on file  Social History Narrative   Married:  2nd   Son in [place]   Step daughter here.   Social Determinants of Health   Financial Resource Strain: Not on file  Food Insecurity: Not on file  Transportation Needs: Not on file  Physical Activity: Not on file  Stress: Not on file  Social Connections: Not on file  Intimate Partner Violence: Not on file    Outpatient Medications Prior to Visit  Medication Sig Dispense Refill   acetaminophen (TYLENOL) 325 MG tablet Take 1,000 mg by mouth every 6 (six) hours as needed for moderate pain.     amLODipine (NORVASC) 5 MG tablet TAKE 1 TABLET (5 MG TOTAL) BY MOUTH DAILY. 90 tablet 0   baclofen (LIORESAL) 10 MG tablet Take 1 tablet (10 mg total) by mouth 3 (three) times daily. As needed for muscle  spasm 30 tablet 0   gabapentin (NEURONTIN) 300 MG capsule Take 300 mg by mouth 3 (three) times daily.     icosapent Ethyl (VASCEPA) 1 g capsule Take 2 capsules (2 g total) by mouth 2 (two) times daily. 120 capsule 11   lisinopril (ZESTRIL) 20 MG tablet TAKE 1 TABLET BY MOUTH EVERY DAY 90 tablet 3   meloxicam (MOBIC) 15 MG tablet TAKE 1 TABLET BY MOUTH ONCE A DAY TAKE DAILY FOR TWO WEEKS, AND THEN ONLY AS NEEDED AFTERWARDS.     PROAIR HFA 108 (90 Base) MCG/ACT inhaler TAKE 2 PUFFS BY MOUTH EVERY 6 HOURS AS NEEDED FOR WHEEZE OR SHORTNESS OF BREATH (Patient taking differently: Inhale 1 puff into the lungs every 4 (four) hours as needed for wheezing or shortness of breath.) 8.5 Inhaler 2   promethazine (PHENERGAN) 25 MG tablet Take 1 tablet (25 mg total) by mouth every 6 (six) hours as needed for nausea or vomiting. 20 tablet 1   SUMAtriptan (IMITREX) 6 MG/0.5ML SOLN injection Inject 6 mg into the skin as needed (for cluster headaches).     amoxicillin  (AMOXIL) 500 MG capsule TAKE 1 CAPSULE BY MOUTH THREE TIMES A DAY (Patient not taking: Reported on 11/14/2021) 90 capsule 2   No facility-administered medications prior to visit.    Allergies  Allergen Reactions   Augmentin [Amoxicillin-Pot Clavulanate] Nausea And Vomiting    "throws up violently"   Chlorthalidone Other (See Comments)    Felt bad/lethargic   Losartan Hives    Potential reaction to losartan   Sulfa Antibiotics Other (See Comments)    Jaundice        Objective:    Physical Exam Constitutional:      General: He is not in acute distress.    Appearance: Normal appearance. He is normal weight. He is not ill-appearing, toxic-appearing or diaphoretic.  Pulmonary:     Effort: Pulmonary effort is normal.  Skin:    Comments: Right second metacarpal with roofed blister, very slight erythema surrounding blister. Clear to blood drainage.   Neurological:     General: No focal deficit present.     Mental Status: He is alert and oriented to person, place, and time. Mental status is at baseline.  Psychiatric:        Mood and Affect: Mood normal.        Behavior: Behavior normal.        Thought Content: Thought content normal.        Judgment: Judgment normal.     BP (!) 150/82   Pulse 84   Temp 98.2 F (36.8 C)   Resp 16   Ht 5\' 9"  (1.753 m)   Wt 147 lb 8 oz (66.9 kg)   SpO2 96%   BMI 21.78 kg/m  Wt Readings from Last 3 Encounters:  11/14/21 147 lb 8 oz (66.9 kg)  08/30/20 151 lb (68.5 kg)  08/08/20 152 lb (68.9 kg)     Health Maintenance Due  Topic Date Due   COLONOSCOPY (Pts 45-11yrs Insurance coverage will need to be confirmed)  Never done   COVID-19 Vaccine (3 - Pfizer series) 09/27/2019   INFLUENZA VACCINE  11/14/2021    There are no preventive care reminders to display for this patient.  Lab Results  Component Value Date   TSH 1.78 05/27/2008   Lab Results  Component Value Date   WBC 10.0 06/27/2020   HGB 12.4 (L) 06/27/2020   HCT 36.6 (L)  06/27/2020   MCV  104.0 (H) 06/27/2020   PLT 233 06/27/2020   Lab Results  Component Value Date   NA 135 06/27/2020   K 3.8 06/27/2020   CO2 24 06/27/2020   GLUCOSE 97 06/27/2020   BUN 16 06/27/2020   CREATININE 0.91 06/27/2020   BILITOT 0.9 06/16/2020   ALKPHOS 105 04/14/2020   AST 301 (H) 06/16/2020   ALT 364 (H) 06/16/2020   PROT 7.9 06/16/2020   ALBUMIN 3.3 (L) 04/14/2020   CALCIUM 9.5 06/27/2020   ANIONGAP 8 06/27/2020   GFR 51.59 (L) 09/29/2015   No results found for: "HGBA1C"    Assessment & Plan:   Problem List Items Addressed This Visit       Musculoskeletal and Integument   Insect bite finger-infected, initial encounter - Primary    RX doxycycline 100 mg po bid x 10 days Wound culture ordered pending results Watch for worsening s/s infection        Relevant Medications   doxycycline (VIBRA-TABS) 100 MG tablet   Other Relevant Orders   WOUND CULTURE    Meds ordered this encounter  Medications   doxycycline (VIBRA-TABS) 100 MG tablet    Sig: Take 1 tablet (100 mg total) by mouth 2 (two) times daily for 10 days.    Dispense:  20 tablet    Refill:  0    Order Specific Question:   Supervising Provider    Answer:   BEDSOLE, AMY E [[id number]]    Follow-up: Return if symptoms worsen or fail to improve with pcp.    Mort Sawyers, FNP

## 2021-11-17 LAB — WOUND CULTURE
MICRO NUMBER:: 13720692
SPECIMEN QUALITY:: ADEQUATE

## 2021-12-07 ENCOUNTER — Encounter: Payer: Self-pay | Admitting: Family Medicine

## 2021-12-07 ENCOUNTER — Ambulatory Visit: Payer: 59 | Admitting: Family Medicine

## 2021-12-07 VITALS — BP 160/74 | HR 59 | Temp 98.2°F | Ht 69.0 in | Wt 149.4 lb

## 2021-12-07 DIAGNOSIS — R238 Other skin changes: Secondary | ICD-10-CM

## 2021-12-07 DIAGNOSIS — R5383 Other fatigue: Secondary | ICD-10-CM

## 2021-12-07 LAB — CBC WITH DIFFERENTIAL/PLATELET
Basophils Absolute: 0 10*3/uL (ref 0.0–0.1)
Basophils Relative: 0.4 % (ref 0.0–3.0)
Eosinophils Absolute: 0.9 10*3/uL — ABNORMAL HIGH (ref 0.0–0.7)
Eosinophils Relative: 15.3 % — ABNORMAL HIGH (ref 0.0–5.0)
HCT: 40.8 % (ref 39.0–52.0)
Hemoglobin: 13.8 g/dL (ref 13.0–17.0)
Lymphocytes Relative: 34.4 % (ref 12.0–46.0)
Lymphs Abs: 2.1 10*3/uL (ref 0.7–4.0)
MCHC: 33.9 g/dL (ref 30.0–36.0)
MCV: 101.5 fl — ABNORMAL HIGH (ref 78.0–100.0)
Monocytes Absolute: 0.5 10*3/uL (ref 0.1–1.0)
Monocytes Relative: 9 % (ref 3.0–12.0)
Neutro Abs: 2.5 10*3/uL (ref 1.4–7.7)
Neutrophils Relative %: 40.9 % — ABNORMAL LOW (ref 43.0–77.0)
Platelets: 213 10*3/uL (ref 150.0–400.0)
RBC: 4.02 Mil/uL — ABNORMAL LOW (ref 4.22–5.81)
RDW: 12.7 % (ref 11.5–15.5)
WBC: 6 10*3/uL (ref 4.0–10.5)

## 2021-12-07 LAB — HEPATIC FUNCTION PANEL
ALT: 33 U/L (ref 0–53)
AST: 47 U/L — ABNORMAL HIGH (ref 0–37)
Albumin: 4 g/dL (ref 3.5–5.2)
Alkaline Phosphatase: 97 U/L (ref 39–117)
Bilirubin, Direct: 0.1 mg/dL (ref 0.0–0.3)
Total Bilirubin: 0.3 mg/dL (ref 0.2–1.2)
Total Protein: 7.5 g/dL (ref 6.0–8.3)

## 2021-12-07 LAB — BASIC METABOLIC PANEL
BUN: 18 mg/dL (ref 6–23)
CO2: 29 mEq/L (ref 19–32)
Calcium: 9.4 mg/dL (ref 8.4–10.5)
Chloride: 103 mEq/L (ref 96–112)
Creatinine, Ser: 1 mg/dL (ref 0.40–1.50)
GFR: 81.25 mL/min (ref >60.00)
Glucose, Bld: 85 mg/dL (ref 70–99)
Potassium: 4.3 mEq/L (ref 3.5–5.1)
Sodium: 139 mEq/L (ref 135–145)

## 2021-12-07 LAB — TSH: TSH: 1.77 u[IU]/mL (ref 0.35–5.50)

## 2021-12-07 MED ORDER — FLUOCINONIDE 0.05 % EX CREA: 1.0000 | TOPICAL_CREAM | Freq: Two times a day (BID) | CUTANEOUS | 0 refills | Status: AC

## 2021-12-07 NOTE — Progress Notes (Signed)
Icel Castles T. Jabes Primo, MD, CAQ Sports Medicine [facility] at [facility] [address] [place], [address]  Phone: [phone]  FAX: [phone]  Roy Patel - 61 y.o. male  MRN [id number]  Date of Birth: [birth year]  Date: 12/07/2021  PCP: [name], MD  Referral: [name], MD  Chief Complaint  Patient presents with   Blisters on Hands    Seen by Dugal on 11/14/21   Subjective:   Roy Patel is a 61 y.o. very pleasant male patient with Body mass index is 22.06 kg/m. who presents with the following:  Saw on 11/14/2021 and felt to be an infected insect bite by Ms. Dugal, and he was placed on Doxycycline.  For the last 3 weeks he has had various stages of bulla formation on his hands as well as broken blisters and some evidence of pruritus and excoriation after the bulla have busted.  They are not hot and not dramatically tender to palpation.  He does not think he has had any new or different exposure, though he does work with chemicals for golf course maintenance daily.  Additional history is significant for ongoing eczema.  He does have a dermatologist, as well.  Works with chemicals at work.    Review of Systems is noted in the HPI, as appropriate  Objective:   BP (!) 160/74   Pulse (!) 59   Temp 98.2 F (36.8 C) (Oral)   Ht 5\' 9"  (1.753 m)   Wt 149 lb 6 oz (67.8 kg)   SpO2 97%   BMI 22.06 kg/m   GEN: No acute distress; alert,appropriate. PULM: Breathing comfortably in no respiratory distress PSYCH: Normally interactive.   He is able to make a full composite fist.  Grossly nontender with palpation throughout.       Laboratory and Imaging Data:  Assessment and Plan:     ICD-10-CM   1. Skin bulla  R23.8 Basic metabolic panel    Hepatic function panel    CBC with Differential/Platelet    TSH    fluocinonide cream (LIDEX) 0.05 %    2. Other fatigue  R53.83 Basic metabolic panel    Hepatic function panel     CBC with Differential/Platelet    TSH    fluocinonide cream (LIDEX) 0.05 %     Bullous rash of unclear origin.  This may have a component of dyshidrotic eczema, but it does not appear classic in appearance, and the bulla are all more than usual.  I do not think that this appears actively infected, but I will also check a CBC and basic labs.  Fortunately, he already does have a dermatological follow-up appointment made, and I think that I would greatly value their opinion here.  We will have him start some Lidex cream on the areas of his hand that are pruritic, avoiding the below themselves.  Also consulted with one of my partners and had them also evaluate the patient face-to-face.  Medication Management during today's office visit: Meds ordered this encounter  Medications   fluocinonide cream (LIDEX) 0.05 %    Sig: Apply 1 Application topically 2 (two) times daily.    Dispense:  30 g    Refill:  0   There are no discontinued medications.  Orders placed today for conditions managed today: Orders Placed This Encounter  Procedures   Basic metabolic panel   Hepatic function panel   CBC with Differential/Platelet   TSH  Disposition: No follow-ups on file.  Dr[employer] One speech-to-text software was used for transcription in this dictation.  Possible transcriptional errors can occur using Animal nutritionist.   Signed,  Elpidio Galea. Arjuna Doeden, MD   Outpatient Encounter Medications as of 12/07/2021  Medication Sig   acetaminophen (TYLENOL) 325 MG tablet Take 1,000 mg by mouth every 6 (six) hours as needed for moderate pain.   amLODipine (NORVASC) 5 MG tablet TAKE 1 TABLET (5 MG TOTAL) BY MOUTH DAILY.   baclofen (LIORESAL) 10 MG tablet Take 1 tablet (10 mg total) by mouth 3 (three) times daily. As needed for muscle spasm   fluocinonide cream (LIDEX) 0.05 % Apply 1 Application topically 2 (two) times daily.   gabapentin (NEURONTIN) 300 MG capsule Take 300 mg by mouth 3 (three)  times daily.   icosapent Ethyl (VASCEPA) 1 g capsule Take 2 capsules (2 g total) by mouth 2 (two) times daily.   lisinopril (ZESTRIL) 20 MG tablet TAKE 1 TABLET BY MOUTH EVERY DAY   meloxicam (MOBIC) 15 MG tablet TAKE 1 TABLET BY MOUTH ONCE A DAY TAKE DAILY FOR TWO WEEKS, AND THEN ONLY AS NEEDED AFTERWARDS.   PROAIR HFA 108 (90 Base) MCG/ACT inhaler TAKE 2 PUFFS BY MOUTH EVERY 6 HOURS AS NEEDED FOR WHEEZE OR SHORTNESS OF BREATH   promethazine (PHENERGAN) 25 MG tablet Take 1 tablet (25 mg total) by mouth every 6 (six) hours as needed for nausea or vomiting.   SUMAtriptan (IMITREX) 6 MG/0.5ML SOLN injection Inject 6 mg into the skin as needed (for cluster headaches).   No facility-administered encounter medications on file as of 12/07/2021.

## 2022-02-14 ENCOUNTER — Telehealth: Payer: Self-pay | Admitting: Internal Medicine

## 2022-02-14 NOTE — Telephone Encounter (Signed)
Call from Dr Kellie Moor He has hand lesions of porphyria cutanea tarda  She thinks that could be related to hepatitis C (I told her I thought he had gotten treated for that but wasn't sure.  She checked ferritin and it was quite elevated--hemochromatosis can cause PCT. She was willing to make referral to hematology (which is what I would have done to confirm diagnosis and treat if he does have it)  I will review the hepatitis c issue at his upcoming appointment

## 2022-03-05 ENCOUNTER — Other Ambulatory Visit: Payer: Self-pay | Admitting: *Deleted

## 2022-03-05 DIAGNOSIS — R7989 Other specified abnormal findings of blood chemistry: Secondary | ICD-10-CM

## 2022-03-05 NOTE — Progress Notes (Signed)
Lab orders entered for New Patient appt  

## 2022-03-06 ENCOUNTER — Encounter: Payer: Self-pay | Admitting: Nurse Practitioner

## 2022-03-06 ENCOUNTER — Inpatient Hospital Stay: Payer: 59

## 2022-03-06 ENCOUNTER — Inpatient Hospital Stay: Payer: 59 | Attending: Nurse Practitioner | Admitting: Nurse Practitioner

## 2022-03-06 VITALS — BP 154/90 | HR 81 | Temp 98.1°F | Resp 18 | Ht 69.0 in | Wt 144.0 lb

## 2022-03-06 DIAGNOSIS — Z8249 Family history of ischemic heart disease and other diseases of the circulatory system: Secondary | ICD-10-CM | POA: Insufficient documentation

## 2022-03-06 DIAGNOSIS — Z880 Allergy status to penicillin: Secondary | ICD-10-CM | POA: Insufficient documentation

## 2022-03-06 DIAGNOSIS — R7989 Other specified abnormal findings of blood chemistry: Secondary | ICD-10-CM | POA: Insufficient documentation

## 2022-03-06 DIAGNOSIS — Z881 Allergy status to other antibiotic agents status: Secondary | ICD-10-CM | POA: Diagnosis not present

## 2022-03-06 DIAGNOSIS — Z882 Allergy status to sulfonamides status: Secondary | ICD-10-CM | POA: Diagnosis not present

## 2022-03-06 DIAGNOSIS — F17210 Nicotine dependence, cigarettes, uncomplicated: Secondary | ICD-10-CM

## 2022-03-06 DIAGNOSIS — Z87891 Personal history of nicotine dependence: Secondary | ICD-10-CM | POA: Insufficient documentation

## 2022-03-06 DIAGNOSIS — Z79899 Other long term (current) drug therapy: Secondary | ICD-10-CM | POA: Diagnosis not present

## 2022-03-06 DIAGNOSIS — I10 Essential (primary) hypertension: Secondary | ICD-10-CM

## 2022-03-06 DIAGNOSIS — Z8042 Family history of malignant neoplasm of prostate: Secondary | ICD-10-CM | POA: Diagnosis not present

## 2022-03-06 DIAGNOSIS — B1920 Unspecified viral hepatitis C without hepatic coma: Secondary | ICD-10-CM

## 2022-03-06 DIAGNOSIS — R21 Rash and other nonspecific skin eruption: Secondary | ICD-10-CM | POA: Insufficient documentation

## 2022-03-06 DIAGNOSIS — Z801 Family history of malignant neoplasm of trachea, bronchus and lung: Secondary | ICD-10-CM | POA: Insufficient documentation

## 2022-03-06 DIAGNOSIS — Z833 Family history of diabetes mellitus: Secondary | ICD-10-CM | POA: Insufficient documentation

## 2022-03-06 DIAGNOSIS — Z8052 Family history of malignant neoplasm of bladder: Secondary | ICD-10-CM

## 2022-03-06 DIAGNOSIS — Z8051 Family history of malignant neoplasm of kidney: Secondary | ICD-10-CM | POA: Diagnosis not present

## 2022-03-06 DIAGNOSIS — Z7289 Other problems related to lifestyle: Secondary | ICD-10-CM

## 2022-03-06 LAB — CBC WITH DIFFERENTIAL (CANCER CENTER ONLY)
Abs Immature Granulocytes: 0.01 10*3/uL (ref 0.00–0.07)
Basophils Absolute: 0.1 10*3/uL (ref 0.0–0.1)
Basophils Relative: 1 %
Eosinophils Absolute: 0.7 10*3/uL — ABNORMAL HIGH (ref 0.0–0.5)
Eosinophils Relative: 10 %
HCT: 46.4 % (ref 39.0–52.0)
Hemoglobin: 16 g/dL (ref 13.0–17.0)
Immature Granulocytes: 0 %
Lymphocytes Relative: 39 %
Lymphs Abs: 2.6 10*3/uL (ref 0.7–4.0)
MCH: 33.5 pg (ref 26.0–34.0)
MCHC: 34.5 g/dL (ref 30.0–36.0)
MCV: 97.3 fL (ref 80.0–100.0)
Monocytes Absolute: 0.4 10*3/uL (ref 0.1–1.0)
Monocytes Relative: 6 %
Neutro Abs: 2.8 10*3/uL (ref 1.7–7.7)
Neutrophils Relative %: 44 %
Platelet Count: 237 10*3/uL (ref 150–400)
RBC: 4.77 MIL/uL (ref 4.22–5.81)
RDW: 12.7 % (ref 11.5–15.5)
WBC Count: 6.5 10*3/uL (ref 4.0–10.5)
nRBC: 0 % (ref 0.0–0.2)

## 2022-03-06 LAB — CMP (CANCER CENTER ONLY)
ALT: 40 U/L (ref 0–44)
AST: 46 U/L — ABNORMAL HIGH (ref 15–41)
Albumin: 4.5 g/dL (ref 3.5–5.0)
Alkaline Phosphatase: 86 U/L (ref 38–126)
Anion gap: 6 (ref 5–15)
BUN: 7 mg/dL — ABNORMAL LOW (ref 8–23)
CO2: 26 mmol/L (ref 22–32)
Calcium: 9.8 mg/dL (ref 8.9–10.3)
Chloride: 106 mmol/L (ref 98–111)
Creatinine: 0.79 mg/dL (ref 0.61–1.24)
GFR, Estimated: >60 mL/min (ref >60)
Glucose, Bld: 148 mg/dL — ABNORMAL HIGH (ref 70–99)
Potassium: 4.2 mmol/L (ref 3.5–5.1)
Sodium: 138 mmol/L (ref 135–145)
Total Bilirubin: 0.4 mg/dL (ref 0.3–1.2)
Total Protein: 8.6 g/dL — ABNORMAL HIGH (ref 6.5–8.1)

## 2022-03-06 LAB — SAVE SMEAR(SSMR), FOR PROVIDER SLIDE REVIEW

## 2022-03-06 LAB — FERRITIN: Ferritin: 271 ng/mL (ref 24–336)

## 2022-03-06 NOTE — Progress Notes (Signed)
New Hematology/Oncology Consult   Requesting MD: Dr. Otilio Patel  [phone]      Reason for Consult: Elevated ferritin, possible porphyria cutanea tarda  HPI: Roy Patel was referred for evaluation of an elevated ferritin and possible porphyria cutanea tarda.  Per Dr. Rayna Patel office note 01/29/2022 he has a blistering, red, scabbing rash right hand, right forearm and left hand.  Ferritin 444 (300-400) on 02/09/2022.  He reports initially noticing the rash on his hands and forearms in August of this year.  The rash is initially "blistery", becomes painful and then ulcerates.  He reports a history of "dry itchy skin" and treatment for eczema.  The blistery rash is only present in sun exposed areas.   Past Medical History:  Diagnosis Date   Cluster headache    G6PD deficiency    ?   Hypertension    Pleurisy      Past Surgical History:  Procedure Laterality Date   ANTERIOR CRUCIATE LIGAMENT REPAIR  2009   MCL removed ([facility])   EXTENSOR TENDON OF FOREARM / WRIST REPAIR  ~1970   Left   IRRIGATION AND DEBRIDEMENT SHOULDER Right 04/14/2020   Procedure: IRRIGATION AND DEBRIDEMENT SHOULDER;  Surgeon: Teryl Lucy, MD;  Location: [facility] OR;  Service: Orthopedics;  Laterality: Right;   MENISCUS REPAIR  1997   [place], Right knee   MENISCUS REPAIR  2000   [place], Left knee   SHOULDER ARTHROSCOPY Right 04/14/2020   Procedure: ARTHROSCOPY SHOULDER with debridement;  Surgeon: Teryl Lucy, MD;  Location: [facility] OR;  Service: Orthopedics;  Laterality: Right;     Current Outpatient Medications:    fluocinonide cream (LIDEX) 0.05 %, Apply 1 Application topically 2 (two) times daily., Disp: 30 g, Rfl: 0   gabapentin (NEURONTIN) 300 MG capsule, Take 300 mg by mouth 3 (three) times daily., Disp: , Rfl:    meloxicam (MOBIC) 15 MG tablet, TAKE 1 TABLET BY MOUTH ONCE A DAY TAKE DAILY FOR TWO WEEKS, AND THEN ONLY AS NEEDED AFTERWARDS., Disp: , Rfl:    PROAIR HFA 108 (90 Base)  MCG/ACT inhaler, TAKE 2 PUFFS BY MOUTH EVERY 6 HOURS AS NEEDED FOR WHEEZE OR SHORTNESS OF BREATH, Disp: 8.5 Inhaler, Rfl: 2   SUMAtriptan (IMITREX) 6 MG/0.5ML SOLN injection, Inject 6 mg into the skin as needed (for cluster headaches)., Disp: , Rfl:    acetaminophen (TYLENOL) 325 MG tablet, Take 1,000 mg by mouth every 6 (six) hours as needed for moderate pain., Disp: , Rfl:    amLODipine (NORVASC) 5 MG tablet, TAKE 1 TABLET (5 MG TOTAL) BY MOUTH DAILY. (Patient not taking: Reported on 03/06/2022), Disp: 90 tablet, Rfl: 0   baclofen (LIORESAL) 10 MG tablet, Take 1 tablet (10 mg total) by mouth 3 (three) times daily. As needed for muscle spasm (Patient not taking: Reported on 03/06/2022), Disp: 30 tablet, Rfl: 0   icosapent Ethyl (VASCEPA) 1 g capsule, Take 2 capsules (2 g total) by mouth 2 (two) times daily., Disp: 120 capsule, Rfl: 11   lisinopril (ZESTRIL) 20 MG tablet, TAKE 1 TABLET BY MOUTH EVERY DAY (Patient not taking: Reported on 03/06/2022), Disp: 90 tablet, Rfl: 3   promethazine (PHENERGAN) 25 MG tablet, Take 1 tablet (25 mg total) by mouth every 6 (six) hours as needed for nausea or vomiting. (Patient not taking: Reported on 03/06/2022), Disp: 20 tablet, Rfl: 1:     Allergies  Allergen Reactions   Augmentin [Amoxicillin-Pot Clavulanate] Nausea And Vomiting    "throws up violently"   Chlorthalidone  Other (See Comments)    Felt bad/lethargic   Losartan Hives    Potential reaction to losartan   Sulfa Antibiotics Other (See Comments)    Jaundice    FH: Mother deceased with lung cancer.  Father deceased CHF, diabetes.  Brother with prostate cancer.  Brother with bladder cancer.  SOCIAL HISTORY: He lives in [place] with his wife.  He works on a golf course.  He smokes 1 pack/day x 20 years.  He estimates drinking a sixpack of beer 3-4 times a week.  Review of Systems: No fevers or sweats.  Appetite varies.  Weight is stable.  Skin rash initially noted August 2023.  No dysphagia.   Occasional dyspnea on exertion.  No cough.  No chest pain.  No leg swelling or calf pain.  No change in bowel habits.  No bloody or black stools.  He has had numbness in the right foot for a few years.  He has tingling in both arms.  Physical Exam:  Blood pressure (!) 154/90, pulse 81, temperature 98.1 F (36.7 C), temperature source Oral, resp. rate 18, height 5\' 9"  (1.753 m), weight 144 lb (65.3 kg), SpO2 98 %.  HEENT: No thrush or ulcers. Lungs: Distant breath sounds.  No respiratory distress. Cardiac: Regular rate and rhythm. Abdomen: No hepatosplenomegaly. Vascular: No leg edema. Lymph nodes: No palpable cervical, supraclavicular, axillary or inguinal lymph nodes. Neurologic: Alert and oriented. Skin: Blister type lesion at the right small finger.  Scattered superficially ulcerated and scabbed lesions both hands and forearms.  Brown discoloration of the lower legs and feet.   LABS:   Recent Labs    03/06/22 1047  WBC 6.5  HGB 16.0  HCT 46.4  PLT 237    No results for input(s): "NA", "K", "CL", "CO2", "GLUCOSE", "BUN", "CREATININE", "CALCIUM" in the last 72 hours.    RADIOLOGY:  No results found.  Assessment and Plan:   Elevated ferritin (440) 02/09/2022 03/06/2022 ferritin normal at 271 Skin rash Hepatitis C, untreated Tobacco use Alcohol use Hypertension History of right shoulder infection  Roy Patel was referred for evaluation of an elevated ferritin and possible porphyria cutanea tarda.  Repeat ferritin in our office today is normal.  He has a skin rash of unclear etiology.  We will obtain lab testing for porphyria including blood and urine.  He has untreated hepatitis C.  We recommend he follow-up with Dr. Earlene Patel to discuss treatment.  He has a significant tobacco history.  We recommend a referral to the lung cancer screening program.  He will return for a follow-up appointment in approximately 3 weeks.  Patient seen with Dr. Alphonsus Patel.    Roy Perna, NP 03/06/2022, 12:59 PM  This was a shared visit with 03/08/2022.  Roy Patel was interviewed and examined.  He is referred to consider a diagnosis of porphyria cutanea tarda.  He has a skin rash consistent with porphyria, but a diagnosis has not been established.  He has additional risk factors for porphyria cutanea tarda including hepatitis C and heavy alcohol use.  The ferritin level returned normal today.  We will obtain additional diagnostic evaluation with serum and urine porphyrin levels.  We encouraged him to avoid sun exposure.  He will see Dr. Earlene Patel to discuss treatment of hepatitis C.  We recommend he be referred to the lung cancer screening program.  I was present for greater than 50% today's visit.  I performed medical decision making.  Roy Sias, MD

## 2022-03-09 LAB — PORPHYRINS, FRACTIONATION-PLASMA

## 2022-03-12 ENCOUNTER — Telehealth: Payer: Self-pay

## 2022-03-12 ENCOUNTER — Other Ambulatory Visit: Payer: Self-pay

## 2022-03-12 DIAGNOSIS — R7989 Other specified abnormal findings of blood chemistry: Secondary | ICD-10-CM

## 2022-03-12 NOTE — Telephone Encounter (Signed)
I called the patient to let him know his Porphyrins profile was cancel due to mishandle of [facility] lab. I need for him to come again for lab to be draw  at [facility]. I spoke with Deri Fuelling and she is aware that the patient will be coming to have his lab draw at [facility]. He is schedule at 11:15 on 03/14/22. Patient gave verbal understanding and had no further questions or concern at this time.

## 2022-03-14 ENCOUNTER — Inpatient Hospital Stay: Payer: 59

## 2022-03-14 ENCOUNTER — Other Ambulatory Visit: Payer: Self-pay

## 2022-03-14 DIAGNOSIS — R7989 Other specified abnormal findings of blood chemistry: Secondary | ICD-10-CM

## 2022-03-16 ENCOUNTER — Other Ambulatory Visit (HOSPITAL_BASED_OUTPATIENT_CLINIC_OR_DEPARTMENT_OTHER): Payer: Self-pay

## 2022-03-16 DIAGNOSIS — Z79899 Other long term (current) drug therapy: Secondary | ICD-10-CM | POA: Insufficient documentation

## 2022-03-16 DIAGNOSIS — F17210 Nicotine dependence, cigarettes, uncomplicated: Secondary | ICD-10-CM | POA: Diagnosis not present

## 2022-03-16 DIAGNOSIS — R21 Rash and other nonspecific skin eruption: Secondary | ICD-10-CM

## 2022-03-16 DIAGNOSIS — R7989 Other specified abnormal findings of blood chemistry: Secondary | ICD-10-CM | POA: Diagnosis present

## 2022-03-16 DIAGNOSIS — I10 Essential (primary) hypertension: Secondary | ICD-10-CM | POA: Insufficient documentation

## 2022-03-16 DIAGNOSIS — Z7289 Other problems related to lifestyle: Secondary | ICD-10-CM | POA: Insufficient documentation

## 2022-03-16 DIAGNOSIS — B1920 Unspecified viral hepatitis C without hepatic coma: Secondary | ICD-10-CM | POA: Insufficient documentation

## 2022-03-19 ENCOUNTER — Ambulatory Visit (INDEPENDENT_AMBULATORY_CARE_PROVIDER_SITE_OTHER): Payer: 59 | Admitting: Internal Medicine

## 2022-03-19 ENCOUNTER — Encounter: Payer: Self-pay | Admitting: Internal Medicine

## 2022-03-19 VITALS — BP 168/80 | HR 98 | Temp 98.4°F | Ht 68.5 in | Wt 144.0 lb

## 2022-03-19 DIAGNOSIS — Z0000 Encounter for general adult medical examination without abnormal findings: Secondary | ICD-10-CM

## 2022-03-19 DIAGNOSIS — F17200 Nicotine dependence, unspecified, uncomplicated: Secondary | ICD-10-CM | POA: Diagnosis not present

## 2022-03-19 DIAGNOSIS — Z23 Encounter for immunization: Secondary | ICD-10-CM

## 2022-03-19 DIAGNOSIS — B182 Chronic viral hepatitis C: Secondary | ICD-10-CM

## 2022-03-19 DIAGNOSIS — Z1211 Encounter for screening for malignant neoplasm of colon: Secondary | ICD-10-CM

## 2022-03-19 NOTE — Assessment & Plan Note (Signed)
Will set up colonoscopy Will ask oncology to check PSA Flu vaccine today Recommended updated COVID vaccine

## 2022-03-19 NOTE — Assessment & Plan Note (Signed)
Will refer for  lung cancer screening 

## 2022-03-19 NOTE — Assessment & Plan Note (Signed)
Will make referral for definitive Rx

## 2022-03-19 NOTE — Progress Notes (Signed)
Subjective:    Patient ID: Roy Patel, male    DOB: 20-Oct-1960, 61 y.o.   MRN: [id number]  HPI Here for physical  Diagnosed with porphyria cutanea tarda Now seeing oncologist  Is ready to go ahead with hepatitis C Rx  Never got the colonoscopy Was having trouble with sciatica and getting ESI--so never followed through  Still smoking --not mentally ready for this  Current Outpatient Medications on File Prior to Visit  Medication Sig Dispense Refill   amLODipine (NORVASC) 5 MG tablet TAKE 1 TABLET (5 MG TOTAL) BY MOUTH DAILY. 90 tablet 0   baclofen (LIORESAL) 10 MG tablet Take 1 tablet (10 mg total) by mouth 3 (three) times daily. As needed for muscle spasm 30 tablet 0   fluocinonide cream (LIDEX) 0.05 % Apply 1 Application topically 2 (two) times daily. 30 g 0   gabapentin (NEURONTIN) 300 MG capsule Take 300 mg by mouth 3 (three) times daily.     lisinopril (ZESTRIL) 20 MG tablet TAKE 1 TABLET BY MOUTH EVERY DAY 90 tablet 3   meloxicam (MOBIC) 15 MG tablet TAKE 1 TABLET BY MOUTH ONCE A DAY TAKE DAILY FOR TWO WEEKS, AND THEN ONLY AS NEEDED AFTERWARDS.     PROAIR HFA 108 (90 Base) MCG/ACT inhaler TAKE 2 PUFFS BY MOUTH EVERY 6 HOURS AS NEEDED FOR WHEEZE OR SHORTNESS OF BREATH 8.5 Inhaler 2   promethazine (PHENERGAN) 25 MG tablet Take 1 tablet (25 mg total) by mouth every 6 (six) hours as needed for nausea or vomiting. 20 tablet 1   SUMAtriptan (IMITREX) 6 MG/0.5ML SOLN injection Inject 6 mg into the skin as needed (for cluster headaches).     No current facility-administered medications on file prior to visit.    Allergies  Allergen Reactions   Augmentin [Amoxicillin-Pot Clavulanate] Nausea And Vomiting    "throws up violently"   Chlorthalidone Other (See Comments)    Felt bad/lethargic   Losartan Hives    Potential reaction to losartan   Sulfa Antibiotics Other (See Comments)    Jaundice    Past Medical History:  Diagnosis Date   Cluster headache    G6PD deficiency     ?   Hypertension    Pleurisy     Past Surgical History:  Procedure Laterality Date   ANTERIOR CRUCIATE LIGAMENT REPAIR  2009   MCL removed ([facility])   EXTENSOR TENDON OF FOREARM / WRIST REPAIR  ~1970   Left   IRRIGATION AND DEBRIDEMENT SHOULDER Right 04/14/2020   Procedure: IRRIGATION AND DEBRIDEMENT SHOULDER;  Surgeon: Teryl Lucy, MD;  Location: [facility] OR;  Service: Orthopedics;  Laterality: Right;   MENISCUS REPAIR  1997   [place], Right knee   MENISCUS REPAIR  2000   [place], Left knee   SHOULDER ARTHROSCOPY Right 04/14/2020   Procedure: ARTHROSCOPY SHOULDER with debridement;  Surgeon: Teryl Lucy, MD;  Location: [facility] OR;  Service: Orthopedics;  Laterality: Right;    Family History  Problem Relation Age of Onset   Cancer Mother        Lung   Diabetes Father    Heart disease Father        CHF   Colon polyps Brother    Colon polyps Brother    Hypertension Neg Hx     Social History   Socioeconomic History   Marital status: Married    Spouse name: Not on file   Number of children: 1   Years of education: Not on file   Highest  education level: Not on file  Occupational History   Occupation: Golf Maintenance at [employer]  Tobacco Use   Smoking status: Every Day    Packs/day: 1.00    Types: Cigarettes    Passive exposure: Never   Smokeless tobacco: Never  Vaping Use   Vaping Use: Never used  Substance and Sexual Activity   Alcohol use: Yes    Alcohol/week: 2.0 standard drinks of alcohol    Types: 2 Cans of beer per week    Comment: occasional   Drug use: Never   Sexual activity: Not on file  Other Topics Concern   Not on file  Social History Narrative   Married:  2nd   Son in [place]   Step daughter here.   Social Determinants of Health   Financial Resource Strain: Not on file  Food Insecurity: Not on file  Transportation Needs: Not on file  Physical Activity: Not on file  Stress: Not on file  Social Connections: Not on file  Intimate Partner  Violence: Not on file   Review of Systems  Constitutional:  Negative for fatigue and unexpected weight change.       Wears seat belt  HENT:  Negative for hearing loss, tinnitus and trouble swallowing.        Bad teeth---will need extractions  Eyes:  Negative for visual disturbance.       No diplopia or unilateral vision loss  Respiratory:  Positive for cough. Negative for wheezing.        Slight SOB at times  Cardiovascular:  Negative for chest pain, palpitations and leg swelling.  Gastrointestinal:  Negative for blood in stool and constipation.       No major heartburn--tums helps  Endocrine: Negative for polydipsia and polyuria.  Genitourinary:  Negative for difficulty urinating and urgency.       No sexual problems  Musculoskeletal:  Positive for back pain. Negative for joint swelling.       Has some triggering in thumbs---still in left  Allergic/Immunologic: Positive for environmental allergies. Negative for immunocompromised state.       Uses nasal spray  Neurological:  Negative for dizziness, syncope, light-headedness and headaches.  Hematological:  Negative for adenopathy. Bruises/bleeds easily.  Psychiatric/Behavioral:         Episodic sleep problems Reactive depression--going to cancer doctor Mild anxiety       Objective:   Physical Exam Constitutional:      Appearance: Normal appearance.  HENT:     Mouth/Throat:     Pharynx: No oropharyngeal exudate or posterior oropharyngeal erythema.  Eyes:     Conjunctiva/sclera: Conjunctivae normal.     Pupils: Pupils are equal, round, and reactive to light.  Cardiovascular:     Rate and Rhythm: Normal rate and regular rhythm.     Heart sounds: No murmur heard.    No gallop.  Pulmonary:     Effort: Pulmonary effort is normal.     Comments: Decreased breath sounds but clear Abdominal:     Palpations: Abdomen is soft.     Tenderness: There is no abdominal tenderness.  Musculoskeletal:     Cervical back: Neck supple.      Right lower leg: No edema.     Left lower leg: No edema.  Lymphadenopathy:     Cervical: No cervical adenopathy.  Neurological:     General: No focal deficit present.     Mental Status: He is alert and oriented to person, place, and time.  Psychiatric:  Mood and Affect: Mood normal.        Behavior: Behavior normal.            Assessment & Plan:

## 2022-03-19 NOTE — Patient Instructions (Addendum)
Please make sure you are using fluticasone nasal spray (not other over the counter ones like oxymetolazone/afrin)  You can try over the counter diclofenac gel on your thumbs.

## 2022-03-20 LAB — PORPHYRINS, FRACTIONATED URINE (TIMED COLLECTION)
Copropor(CP)III,24hr: 46 ug/24 hr (ref 0–74)
Coproporph(CP)I,24hr: 40 ug/24 hr — ABNORMAL HIGH (ref 0–24)
Coproporphyrin I: 19 ug/L
Coproporphyrin III: 22 ug/L
Heptacarboxyl (7-CP): 231 ug/L
Heptacarboxyl Porphyrin, 24H Ur: 485 ug/24 hr — ABNORMAL HIGH (ref 0–4)
Hexacarboxyl (6-CP): 5 ug/L
Hexacarboxyporphyrin: 11 ug/24 hr — ABNORMAL HIGH (ref 0–1)
Pentacarboxyl (5-CP): 27 ug/L
Pentacarboxyporphyrin: 57 ug/24 hr — ABNORMAL HIGH (ref 0–4)
Total Volume: 2100
Uroporphyrin, Urine: 861 ug/24 hr — ABNORMAL HIGH (ref 0–24)
Uroporphyrins (UP): 410 ug/L

## 2022-03-20 LAB — MISC LABCORP TEST (SEND OUT): Labcorp test code: 823239

## 2022-03-23 ENCOUNTER — Encounter: Payer: Self-pay | Admitting: *Deleted

## 2022-03-23 ENCOUNTER — Inpatient Hospital Stay: Payer: 59 | Attending: Nurse Practitioner | Admitting: Nurse Practitioner

## 2022-03-23 ENCOUNTER — Encounter: Payer: Self-pay | Admitting: Nurse Practitioner

## 2022-03-23 ENCOUNTER — Inpatient Hospital Stay: Payer: 59

## 2022-03-23 DIAGNOSIS — R21 Rash and other nonspecific skin eruption: Secondary | ICD-10-CM | POA: Diagnosis not present

## 2022-03-23 DIAGNOSIS — Z0000 Encounter for general adult medical examination without abnormal findings: Secondary | ICD-10-CM

## 2022-03-23 NOTE — Progress Notes (Signed)
REferral faxed to Dr Joellyn Rued at [facility] at [phone]

## 2022-03-23 NOTE — Progress Notes (Signed)
   Cancer Center OFFICE PROGRESS NOTE   Diagnosis: Porphyria cutanea tarda  INTERVAL HISTORY:   Mr. Laymon returns as scheduled.  Skin rash is stable.  No new lesions.  Objective:  Vital signs in last 24 hours:  Blood pressure (!) 153/82, pulse 98, temperature 98.6 F (37 C), temperature source Oral, resp. rate 20, height 5' 8.5" (1.74 m), weight 145 lb 9.6 oz (66 kg), SpO2 99 %.    Resp: Distant breath sounds. Cardio: Regular rate and rhythm. GI: No hepatosplenomegaly. Vascular: No leg edema. Neuro: Alert and oriented. Skin: Scattered superficially ulcerated and scabbed lesions hands and forearms.   Lab Results:  Lab Results  Component Value Date   WBC 6.5 03/06/2022   HGB 16.0 03/06/2022   HCT 46.4 03/06/2022   MCV 97.3 03/06/2022   PLT 237 03/06/2022   NEUTROABS 2.8 03/06/2022    Imaging:  No results found.  Medications: I have reviewed the patient's current medications.  Assessment/Plan: Elevated ferritin (440) 02/09/2022 03/06/2022 ferritin normal at 271 Skin rash-porphyria cutanea tarda 03/14/2022 porphyrins fractionated plasma elevated 6.5 03/16/2022 quantitative 24-hour urine porphyrins, elevated Hepatitis C, untreated-has appointment with ID 03/26/2022 Tobacco use-referred to the lung cancer screening program Alcohol use Hypertension History of right shoulder infection  Disposition: Mr. Busk appears unchanged.  Skin rash is stable.  We reviewed the urine and serum porphyrin test results.  He understands the levels are elevated.  We have made a referral to Dr. Leighton Parody, gastroenterology at [facility].  He has been referred to ID to discuss treatment of hepatitis C.  He has been referred to the lung cancer screening program.  Mr. Egolf will return for follow-up in 4 to 6 weeks.  Patient seen with Dr. [name].  Lonna Cobb ANP/GNP-BC   03/23/2022  1:35 PM This was a shared visit with Lonna Cobb.  Mr.  Sickinger has a clinical presentation and initial laboratory screening consistent with a diagnosis of porphyria cutanea tarda.  We will refer him to Dr. Joellyn Rued for confirmation of the diagnosis and management of porphyria.  We recommend he follow-up with infectious disease treatment of hepatitis C.  Hepatitis C may be contributing to the porphyria manifestations. I was present for greater than 50% of today's visit.  I performed medical decision making.  Mancel Bale, MD

## 2022-03-24 LAB — PROSTATE-SPECIFIC AG, SERUM (LABCORP): Prostate Specific Ag, Serum: 0.2 ng/mL (ref 0.0–4.0)

## 2022-03-26 ENCOUNTER — Other Ambulatory Visit: Payer: Self-pay

## 2022-03-26 ENCOUNTER — Encounter: Payer: Self-pay | Admitting: Internal Medicine

## 2022-03-26 ENCOUNTER — Ambulatory Visit (INDEPENDENT_AMBULATORY_CARE_PROVIDER_SITE_OTHER): Payer: 59 | Admitting: Internal Medicine

## 2022-03-26 VITALS — BP 188/100 | HR 85 | Temp 98.1°F | Ht 69.5 in | Wt 147.0 lb

## 2022-03-26 DIAGNOSIS — T8450XS Infection and inflammatory reaction due to unspecified internal joint prosthesis, sequela: Secondary | ICD-10-CM

## 2022-03-26 DIAGNOSIS — T8450XD Infection and inflammatory reaction due to unspecified internal joint prosthesis, subsequent encounter: Secondary | ICD-10-CM

## 2022-03-26 DIAGNOSIS — Z96611 Presence of right artificial shoulder joint: Secondary | ICD-10-CM | POA: Diagnosis not present

## 2022-03-26 DIAGNOSIS — B182 Chronic viral hepatitis C: Secondary | ICD-10-CM | POA: Diagnosis not present

## 2022-03-26 NOTE — Progress Notes (Signed)
[facility] Infect[facility]                                      [address], [place], [place], [address]                                               Phn. [phone]; Fax: [phone]                                                               Date:  Reason for Visit: Hepatitis C    HPI: Roy Patel is a 61 y.o.old male with PMHX as below presents for management of HCV(27.7 M with genotype 1a on  04/16/20). Previously seen for right shoulder PJI.  He had undergone right rotator cuff surgery with  tendon repair and bone spurss removed on 03/17/2020 complicated by prosthetic joint infection status post I&D on 12/30 hardware removed and 2 anchors  retained cultures growing P acnes, staph hemolyticus, Staph hominis treated with vancomycin and ceftriaxone x 4 weeks EOT 05/12/2020 transition to p.o. amoxicillin 500 mg 3 times daily to complete a year of antibiotics.  Last seen by Dr. Ninetta Lights in May, 2022 with plan to follow-up in 4 months which would complete about a year of antibiotics.  At that time hep C treatment was held off with patient completed amoxicillin.  Wife present at visit. Pt did contract hepatitis C from wife.  His wife works as a Retail banker and contracted hepatitis C at work.  She had been treated for hepatitis C about 5 years ago.  Patient reported that in his 77s he was in [place] when he consumed large amount of seafood and had to be seen by GI for hepatitis episode with jaundice.  He reports he did not have hepatitis C at that point.  Denies h/o IVDA, blood transfusion, sharing of toothbrushes/razors or [employer].  No pfamily history of liver disease, Hepatitis or Liver cancer.  He has not received treatment to date   Denies any hospitalizations related to liver disease, jaundice, ascites, GI bleeding, mental status changes, abdominal pain and acholic stool.   ROS: Denies yellowish discoloration of sclera and  skin, abdominal pain/distension, hematemesis.  Denis cough, fever, chills, nightsweats, nausea, vomiting, diarrhea, constipation, weight loss, recent hospitalizations, rashes, joint complaints, shortness of breath, chest pain, headaches, dysuria .   Current Outpatient Medications on File Prior to Visit  Medication Sig Dispense Refill   amLODipine (NORVASC) 5 MG tablet TAKE 1 TABLET (5 MG TOTAL) BY MOUTH DAILY. 90 tablet 0   baclofen (LIORESAL) 10 MG tablet Take 1 tablet (10 mg total) by mouth 3 (three) times daily. As needed for muscle spasm 30 tablet 0   fluocinonide cream (LIDEX) 0.05 % Apply 1 Application topically 2 (two) times daily. 30 g 0   gabapentin (NEURONTIN) 300 MG capsule Take 300 mg by mouth 3 (three) times daily.     lisinopril (ZESTRIL) 20 MG tablet TAKE 1 TABLET BY MOUTH EVERY DAY 90 tablet 3   meloxicam (MOBIC) 15  MG tablet TAKE 1 TABLET BY MOUTH ONCE A DAY TAKE DAILY FOR TWO WEEKS, AND THEN ONLY AS NEEDED AFTERWARDS.     PROAIR HFA 108 (90 Base) MCG/ACT inhaler TAKE 2 PUFFS BY MOUTH EVERY 6 HOURS AS NEEDED FOR WHEEZE OR SHORTNESS OF BREATH 8.5 Inhaler 2   promethazine (PHENERGAN) 25 MG tablet Take 1 tablet (25 mg total) by mouth every 6 (six) hours as needed for nausea or vomiting. 20 tablet 1   SUMAtriptan (IMITREX) 6 MG/0.5ML SOLN injection Inject 6 mg into the skin as needed (for cluster headaches).     triamcinolone cream (KENALOG) 0.1 % SMARTSIG:sparingly Topical Twice Daily PRN     No current facility-administered medications on file prior to visit.   Allergies  Allergen Reactions   Augmentin [Amoxicillin-Pot Clavulanate] Nausea And Vomiting    "throws up violently"   Chlorthalidone Other (See Comments)    Felt bad/lethargic   Losartan Hives    Potential reaction to losartan   Sulfa Antibiotics Other (See Comments)    Jaundice    Past Medical History:  Diagnosis Date   Cluster headache    G6PD deficiency    ?   Hypertension    Pleurisy     Past  Surgical History:  Procedure Laterality Date   ANTERIOR CRUCIATE LIGAMENT REPAIR  2009   MCL removed ([facility])   EXTENSOR TENDON OF FOREARM / WRIST REPAIR  ~1970   Left   IRRIGATION AND DEBRIDEMENT SHOULDER Right 04/14/2020   Procedure: IRRIGATION AND DEBRIDEMENT SHOULDER;  Surgeon: Teryl Lucy, MD;  Location: [facility] OR;  Service: Orthopedics;  Laterality: Right;   MENISCUS REPAIR  1997   [place], Right knee   MENISCUS REPAIR  2000   [place], Left knee   SHOULDER ARTHROSCOPY Right 04/14/2020   Procedure: ARTHROSCOPY SHOULDER with debridement;  Surgeon: Teryl Lucy, MD;  Location: [facility];  Service: Orthopedics;  Laterality: Right;    Social History   Socioeconomic History   Marital status: Married    Spouse name: Not on file   Number of children: 1   Years of education: Not on file   Highest education level: Not on file  Occupational History   Occupation: Golf Maintenance at [employer]  Tobacco Use   Smoking status: Every Day    Packs/day: 1.00    Types: Cigarettes    Passive exposure: Never   Smokeless tobacco: Never  Vaping Use   Vaping Use: Never used  Substance and Sexual Activity   Alcohol use: Yes    Alcohol/week: 2.0 standard drinks of alcohol    Types: 2 Cans of beer per week    Comment: occasional   Drug use: Never   Sexual activity: Not on file  Other Topics Concern   Not on file  Social History Narrative   Married:  2nd   Son in [place]   Step daughter here.   Social Determinants of Health   Financial Resource Strain: Not on file  Food Insecurity: Not on file  Transportation Needs: Not on file  Physical Activity: Not on file  Stress: Not on file  Social Connections: Not on file  Intimate Partner Violence: Not on file    Family History  Problem Relation Age of Onset   Cancer Mother        Lung   Diabetes Father    Heart disease Father        CHF   Colon polyps Brother    Colon polyps Brother  Hypertension Neg Hx     Physical  exam: There were no vitals taken for this visit.  Gen: Alert and oriented x 3, no acute distress HEENT: [place]/AT, PERL, EOMI, no scleral icterus, no pale conjunctivae, hearing normal, oral mucosa moist Neck: Supple, no lymphadenopathy Cardio: Regular rate and rhythm; +S1 and S2; no murmurs, gallops, or rubs Resp: CTAB; no wheezes, rhonchi, or rales GI: Soft, nontender, nondistended, bowel sounds present GU: Musc: Extremities: No cyanosis, clubbing, or edema; +2 PT and DP pulses Skin: No rashes, lesions, or ecchymoses Neuro: No focal deficits Psych: Calm, cooperative   Laboratory   NS5A resistance testing in select scenarios    Assessment/Plan:  #Chronic HCV (27.7 M with genotype 1a on  04/16/20 Prior treatment: none GT: 1a Evidence of cirrhosis: pending Interested in treatment: yes Potential DDI Labs and imaging(US with elasto- graphy) CBC w diff CMP PT/PTT/INR Hep A and B serologies  HIV HCV genotype HCV RNA Measure of fibrosis Once labs have resulted, will call with medication. Pt will F/U with pharmacy at start of treatment and 4 weeks. F/U with myself at end of treatment.   #Porphyruria cutanea tarda -Followed by oncology, rash started as blister this August with test with PCT. Referred to hepatology for further management of porphyrias. Referred to ID in case HCV is contributing to porphyria manifestation.  -I think treatment of HCV may add to possible resolution of PCT   #Tobacco abuse #Etoh abuse -counseled on smoking cessation -Drinks about 5-6 beers  about 4 times per week, counsel on Etoh cessation   # Right shoulder PJI with retained HWR -Off of amox since October, 20. Completed about 10 months of antibiotics. He has limited motion in right shoulder, last seen by ortho about a year ago. -No current c.f ongoing infection per patient -Labs off of antibiotics  Counseling done on the following -Natural progression of hep c, transmission (avoid sharing  personal hygiene equipment), prevention, risks of left untreated and treatment options  -Avoid hepatotoxins like alcohol and excessive acetamaminphen (no more than 2 gram a day) -Avoid eating raw sea food -Risks of re-infection  -Hepatitis coinfection and vaccination( Pneumococcal vaccination in the cirrhotics    - EGD to r/o varices in cirrhotics   I have personally spent 75 minutes involved in face-to-face and non-face-to-face activities for this patient on the day of the visit. Professional time spent includes the following activities: Preparing to see the patient (review of tests), Obtaining and/or reviewing separately obtained history (admission/discharge record), Performing a medically appropriate examination and/or evaluation , Ordering medications/tests/procedures, referring and communicating with other health care professionals, Documenting clinical information in the EMR, Independently interpreting results (not separately reported), Communicating results to the patient/family/caregiver, Counseling and educating the patient/family/caregiver and Care coordination (not separately reported).   Electronically signed by: Danelle Earthly, MD Infectious Diseases  Fax no. [phone]

## 2022-03-27 LAB — HEPATITIS B CORE ANTIBODY, TOTAL: Hep B Core Total Ab: NONREACTIVE

## 2022-03-29 ENCOUNTER — Ambulatory Visit (HOSPITAL_COMMUNITY): Payer: 59

## 2022-04-03 ENCOUNTER — Ambulatory Visit
Admission: RE | Admit: 2022-04-03 | Discharge: 2022-04-03 | Disposition: A | Discharge status: Home / Self Care | Payer: 59 | Source: Ambulatory Visit | Attending: Internal Medicine | Admitting: Internal Medicine

## 2022-04-03 DIAGNOSIS — B182 Chronic viral hepatitis C: Secondary | ICD-10-CM

## 2022-04-03 LAB — CBC
HCT: 39.3 % (ref 38.5–50.0)
Hemoglobin: 14.2 g/dL (ref 13.2–17.1)
MCH: 34.9 pg — ABNORMAL HIGH (ref 27.0–33.0)
MCHC: 36.1 g/dL — ABNORMAL HIGH (ref 32.0–36.0)
MCV: 96.6 fL (ref 80.0–100.0)
MPV: 9.4 fL (ref 7.5–12.5)
Platelets: 223 10*3/uL (ref 140–400)
RBC: 4.07 10*6/uL — ABNORMAL LOW (ref 4.20–5.80)
RDW: 12.1 % (ref 11.0–15.0)
WBC: 5.7 10*3/uL (ref 3.8–10.8)

## 2022-04-03 LAB — COMPLETE METABOLIC PANEL WITH GFR
AG Ratio: 1.2 (calc) (ref 1.0–2.5)
ALT: 42 U/L (ref 9–46)
AST: 57 U/L — ABNORMAL HIGH (ref 10–35)
Albumin: 4.2 g/dL (ref 3.6–5.1)
Alkaline phosphatase (APISO): 86 U/L (ref 35–144)
BUN: 12 mg/dL (ref 7–25)
CO2: 24 mmol/L (ref 20–32)
Calcium: 9.7 mg/dL (ref 8.6–10.3)
Chloride: 106 mmol/L (ref 98–110)
Creat: 0.74 mg/dL (ref 0.70–1.35)
Globulin: 3.4 g/dL (calc) (ref 1.9–3.7)
Glucose, Bld: 100 mg/dL — ABNORMAL HIGH (ref 65–99)
Potassium: 4.2 mmol/L (ref 3.5–5.3)
Sodium: 138 mmol/L (ref 135–146)
Total Bilirubin: 0.4 mg/dL (ref 0.2–1.2)
Total Protein: 7.6 g/dL (ref 6.1–8.1)
eGFR: 103 mL/min/{1.73_m2} (ref >=60)

## 2022-04-03 LAB — LIVER FIBROSIS, FIBROTEST-ACTITEST
ALT: 44 U/L (ref 9–46)
Alpha-2-Macroglobulin: 436 mg/dL — ABNORMAL HIGH (ref 106–279)
Apolipoprotein A1: 213 mg/dL — ABNORMAL HIGH (ref 94–176)
Bilirubin: 0.4 mg/dL (ref 0.2–1.2)
Fibrosis Score: 0.55
GGT: 135 U/L — ABNORMAL HIGH (ref 3–70)
Haptoglobin: 151 mg/dL (ref 43–212)
Necroinflammat ACT Score: 0.32
Reference ID: 4688289

## 2022-04-03 LAB — HEPATITIS B SURFACE ANTIGEN: Hepatitis B Surface Ag: NONREACTIVE

## 2022-04-03 LAB — PROTIME-INR
INR: 1.2 — ABNORMAL HIGH
Prothrombin Time: 12.1 s — ABNORMAL HIGH (ref 9.0–11.5)

## 2022-04-03 LAB — C-REACTIVE PROTEIN: CRP: 1.2 mg/L (ref <8.0)

## 2022-04-03 LAB — HIV ANTIBODY (ROUTINE TESTING W REFLEX): HIV 1&2 Ab, 4th Generation: NONREACTIVE

## 2022-04-03 LAB — HEPATITIS C RNA QUANTITATIVE
HCV Quantitative Log: 4.89 log IU/mL — ABNORMAL HIGH
HCV RNA, PCR, QN: 78500 IU/mL — ABNORMAL HIGH

## 2022-04-03 LAB — HEPATITIS B SURFACE ANTIBODY,QUALITATIVE: Hep B S Ab: NONREACTIVE

## 2022-04-03 LAB — HEPATITIS A ANTIBODY, TOTAL: Hepatitis A AB,Total: NONREACTIVE

## 2022-04-03 LAB — SEDIMENTATION RATE: Sed Rate: 11 mm/h (ref 0–20)

## 2022-04-03 LAB — HEPATITIS C GENOTYPE

## 2022-04-11 ENCOUNTER — Telehealth: Payer: Self-pay

## 2022-04-11 ENCOUNTER — Other Ambulatory Visit (HOSPITAL_COMMUNITY): Payer: Self-pay

## 2022-04-11 ENCOUNTER — Other Ambulatory Visit: Payer: Self-pay | Admitting: Internal Medicine

## 2022-04-11 DIAGNOSIS — K7460 Unspecified cirrhosis of liver: Secondary | ICD-10-CM

## 2022-04-11 NOTE — Telephone Encounter (Signed)
[facility] Patient Advocate Encounter   Received notification from [employer] that prior authorization for [place] is required.   PA submitted on 04/11/22 Key [id number] Status is pending    [facility] will continue to follow.   Clearance Coots, CPhT Specialty Pharmacy Patient [facility] for Infectious Disease Phone: [phone] Fax:  [phone]

## 2022-04-12 ENCOUNTER — Other Ambulatory Visit (HOSPITAL_COMMUNITY): Payer: Self-pay

## 2022-04-13 ENCOUNTER — Other Ambulatory Visit: Payer: 59

## 2022-04-17 ENCOUNTER — Inpatient Hospital Stay: Payer: 59 | Attending: Nurse Practitioner | Admitting: Oncology

## 2022-04-17 ENCOUNTER — Other Ambulatory Visit (HOSPITAL_COMMUNITY): Payer: Self-pay

## 2022-04-18 ENCOUNTER — Encounter: Payer: Self-pay | Admitting: *Deleted

## 2022-04-18 NOTE — Progress Notes (Signed)
Roy Patel was a "no show" for his 1 year f/u on 04/18/22. Scheduling message sent to reschedule for 3-4 weeks.

## 2022-04-20 ENCOUNTER — Telehealth: Payer: Self-pay

## 2022-04-20 ENCOUNTER — Other Ambulatory Visit (HOSPITAL_COMMUNITY): Payer: Self-pay

## 2022-04-20 NOTE — Telephone Encounter (Signed)
[facility] Patient Advocate Encounter  Prior Authorization for Roy Patel [employer] ) has been approved.    PA# [id number] Effective dates: 03/21/22 through 07/13/22  Prescription will need to be filled at [facility] in [place].  [facility] will continue to follow.  Ileene Patrick, [place] [facility] Patient [facility] for Infectious Disease Phone: [phone] Fax:  [phone]

## 2022-04-26 ENCOUNTER — Other Ambulatory Visit: Payer: Self-pay | Admitting: Pharmacist

## 2022-04-26 ENCOUNTER — Other Ambulatory Visit (HOSPITAL_COMMUNITY): Payer: Self-pay

## 2022-04-26 DIAGNOSIS — B182 Chronic viral hepatitis C: Secondary | ICD-10-CM

## 2022-04-26 MED ORDER — EPCLUSA 400-100 MG PO TABS: 1.0000 | ORAL_TABLET | Freq: Every day | ORAL | 2 refills | Status: AC

## 2022-05-04 ENCOUNTER — Telehealth: Payer: Self-pay | Admitting: Pharmacist

## 2022-05-04 NOTE — Telephone Encounter (Signed)
Patient is approved to receive Epclusa x 12 weeks for chronic Hepatitis C infection. Counseled patient's wife Eulis Foster to take [place] daily with or without food. Encouraged not to miss any doses and explained how their chance of cure could go down with each dose missed. Counseled on what to do if dose is missed - if it is closer to the missed dose take immediately; if closer to next dose skip dose and take the next dose at the usual time. Counseled on common side effects such as headache, fatigue, and nausea and that these normally decrease with time. I reviewed patient medications and found no drug interactions. Discussed that there are several drug interactions including acid suppressants. Instructed to call clinic if he wishes to start a new medication during course of therapy. Also advised to call if he experiences any side effects. Patient will follow-up with Cassie in the pharmacy clinic on 2/19.  Alfonse Spruce, PharmD, CPP, BCIDP, [place] Clinical Pharmacist Practitioner Infectious [place] for Infectious Disease

## 2022-05-19 ENCOUNTER — Encounter: Payer: Self-pay | Admitting: Pharmacist

## 2022-06-04 ENCOUNTER — Ambulatory Visit: Payer: 59 | Admitting: Pharmacist

## 2022-06-12 ENCOUNTER — Other Ambulatory Visit: Payer: Self-pay

## 2022-06-12 ENCOUNTER — Ambulatory Visit (INDEPENDENT_AMBULATORY_CARE_PROVIDER_SITE_OTHER): Payer: 59 | Admitting: Pharmacist

## 2022-06-12 DIAGNOSIS — B182 Chronic viral hepatitis C: Secondary | ICD-10-CM | POA: Diagnosis not present

## 2022-06-12 NOTE — Progress Notes (Unsigned)
06/12/2022  HPI: Roy Patel is a 62 y.o. male who presents to the [place] clinic for Hepatitis C follow-up.  Medication: Raeanne Gathers  Start Date: 05/07/22  Hepatitis C Genotype: 1a  Fibrosis Score: F2  Hepatitis C RNA: 78,500  Patient Active Problem List   Diagnosis Date Noted   Drug eruption 05/10/2020   Preventative health care 09/29/2015   G6PD deficiency 02/05/2015   Essential hypertension 02/05/2015   Chronic viral hepatitis C ([place]) 06/18/2008   CIGARETTE SMOKER 05/27/2008   CLUSTER HEADACHE 05/27/2008   CARPAL TUNNEL SYNDROME 05/27/2008   Palpitations 05/27/2008    Patient's Medications  New Prescriptions   No medications on file  Previous Medications   AMLODIPINE (NORVASC) 5 MG TABLET    TAKE 1 TABLET (5 MG TOTAL) BY MOUTH DAILY.   BACLOFEN (LIORESAL) 10 MG TABLET    Take 1 tablet (10 mg total) by mouth 3 (three) times daily. As needed for muscle spasm   EPCLUSA 400-100 MG TABS    Take 1 tablet by mouth daily.   FLUOCINONIDE CREAM (LIDEX) 0.05 %    Apply 1 Application topically 2 (two) times daily.   GABAPENTIN (NEURONTIN) 300 MG CAPSULE    Take 300 mg by mouth 3 (three) times daily.   LISINOPRIL (ZESTRIL) 20 MG TABLET    TAKE 1 TABLET BY MOUTH EVERY DAY   MELOXICAM (MOBIC) 15 MG TABLET    TAKE 1 TABLET BY MOUTH ONCE A DAY TAKE DAILY FOR TWO WEEKS, AND THEN ONLY AS NEEDED AFTERWARDS.   PROAIR HFA 108 (90 BASE) MCG/ACT INHALER    TAKE 2 PUFFS BY MOUTH EVERY 6 HOURS AS NEEDED FOR WHEEZE OR SHORTNESS OF BREATH   PROMETHAZINE (PHENERGAN) 25 MG TABLET    Take 1 tablet (25 mg total) by mouth every 6 (six) hours as needed for nausea or vomiting.   SUMATRIPTAN (IMITREX) 6 MG/0.5ML SOLN INJECTION    Inject 6 mg into the skin as needed (for cluster headaches).   TRIAMCINOLONE CREAM (KENALOG) 0.1 %    SMARTSIG:sparingly Topical Twice Daily PRN  Modified Medications   No medications on file  Discontinued Medications   No medications on file    Allergies: Allergies   Allergen Reactions   Augmentin [Amoxicillin-Pot Clavulanate] Nausea And Vomiting    "throws up violently"   Chlorthalidone Other (See Comments)    Felt bad/lethargic   Losartan Hives    Potential reaction to losartan   Sulfa Antibiotics Other (See Comments)    Jaundice    Past Medical History: Past Medical History:  Diagnosis Date   Cluster headache    G6PD deficiency    ?   Hypertension    Pleurisy     Social History: Social History   Socioeconomic History   Marital status: Married    Spouse name: Not on file   Number of children: 1   Years of education: Not on file   Highest education level: Not on file  Occupational History   Occupation: Golf Maintenance at [employer] Use   Smoking status: Every Day    Packs/day: 1.00    Types: Cigarettes    Passive exposure: Never   Smokeless tobacco: Never   Tobacco comments:    Has thought about cutting back  Vaping Use   Vaping Use: Never used  Substance and Sexual Activity   Alcohol use: Yes    Alcohol/week: 2.0 standard drinks of alcohol    Types: 2 Cans of beer per  week    Comment: states a couple times per week   Drug use: Never   Sexual activity: Not on file  Other Topics Concern   Not on file  Social History Narrative   Married:  2nd   Son in [place]   Step daughter here.   Social Determinants of Health   Financial Resource Strain: Not on file  Food Insecurity: Not on file  Transportation Needs: Not on file  Physical Activity: Not on file  Stress: Not on file  Social Connections: Not on file    Labs: Hepatitis C Lab Results  Component Value Date   HCVGENOTYPE 1a 03/26/2022   HCVRNAPCRQN 78,500 (H) 03/26/2022   FIBROSTAGE F2 03/26/2022   Hepatitis B Lab Results  Component Value Date   HEPBSAB NON-REACTIVE 03/26/2022   HEPBSAG NON-REACTIVE 03/26/2022   HEPBCAB NON-REACTIVE 03/26/2022   Hepatitis A Lab Results  Component Value Date   HAV NON-REACTIVE 03/26/2022   HIV Lab  Results  Component Value Date   HIV NON-REACTIVE 03/26/2022   HIV Non Reactive 04/15/2020   HIV Non Reactive 02/05/2015   Lab Results  Component Value Date   CREATININE 0.74 03/26/2022   CREATININE 0.79 03/06/2022   CREATININE 1.00 12/07/2021   CREATININE 0.91 06/27/2020   CREATININE 1.00 06/16/2020   Lab Results  Component Value Date   AST 57 (H) 03/26/2022   AST 46 (H) 03/06/2022   AST 47 (H) 12/07/2021   ALT 44 03/26/2022   ALT 42 03/26/2022   ALT 40 03/06/2022   INR 1.2 (H) 03/26/2022   INR 1.18 02/05/2015    Assessment: Roy Patel presents today with his wife for 1 month follow up after initiation of Epclusa for HCV. Today he reports some insomnia since starting Epclusa but otherwise it has been well tolerated. He endorses no missed doses over the past month and has his next month's supply at home. He has no questions today and is looking forward to completing his course of 12 weeks. Will schedule follow up with Dr. Candiss Patel at the completion of treatment.    Plan: - Collect HCV RNA - Continue Epclusa X 12 weeks - Appointment scheduled with Dr. Candiss Patel on 08/15/22 for HCV follow up  Roy Patel, PharmD PGY1 Pharmacy Resident 06/12/2022 3:51 PM

## 2022-06-14 LAB — COMPREHENSIVE METABOLIC PANEL
AG Ratio: 1.3 (calc) (ref 1.0–2.5)
ALT: 17 U/L (ref 9–46)
AST: 23 U/L (ref 10–35)
Albumin: 3.8 g/dL (ref 3.6–5.1)
Alkaline phosphatase (APISO): 79 U/L (ref 35–144)
BUN: 11 mg/dL (ref 7–25)
CO2: 29 mmol/L (ref 20–32)
Calcium: 9.4 mg/dL (ref 8.6–10.3)
Chloride: 106 mmol/L (ref 98–110)
Creat: 1.02 mg/dL (ref 0.70–1.35)
Globulin: 2.9 g/dL (calc) (ref 1.9–3.7)
Glucose, Bld: 103 mg/dL — ABNORMAL HIGH (ref 65–99)
Potassium: 4.2 mmol/L (ref 3.5–5.3)
Sodium: 141 mmol/L (ref 135–146)
Total Bilirubin: 0.2 mg/dL (ref 0.2–1.2)
Total Protein: 6.7 g/dL (ref 6.1–8.1)

## 2022-06-14 LAB — HEPATITIS C RNA QUANTITATIVE
HCV Quantitative Log: <1.18 log IU/mL
HCV RNA, PCR, QN: <15 IU/mL

## 2022-08-15 ENCOUNTER — Other Ambulatory Visit: Payer: Self-pay

## 2022-08-15 ENCOUNTER — Encounter: Payer: Self-pay | Admitting: Internal Medicine

## 2022-08-15 ENCOUNTER — Ambulatory Visit: Payer: 59 | Admitting: Internal Medicine

## 2022-08-15 VITALS — BP 187/94 | HR 70 | Temp 97.9°F | Wt 151.0 lb

## 2022-08-15 DIAGNOSIS — B182 Chronic viral hepatitis C: Secondary | ICD-10-CM

## 2022-08-15 MED ORDER — NICOTINE POLACRILEX 4 MG MT GUM
4.0000 mg | CHEWING_GUM | OROMUCOSAL | 0 refills | Status: AC | PRN
Start: 2022-08-15 — End: ?

## 2022-08-15 NOTE — Progress Notes (Signed)
Patient: Roy Patel  DOB: 1961-01-11 MRN: [id number] PCP: [name], MD   Chief Complaint  Patient presents with   Follow-up     Patient Active Problem List   Diagnosis Date Noted   Drug eruption 05/10/2020   Preventative health care 09/29/2015   G6PD deficiency 02/05/2015   Essential hypertension 02/05/2015   Chronic viral hepatitis C (HCC) 06/18/2008   CIGARETTE SMOKER 05/27/2008   CLUSTER HEADACHE 05/27/2008   CARPAL TUNNEL SYNDROME 05/27/2008   Palpitations 05/27/2008     Subjective:  Roy Patel is a 62 y.o. male with PMHX as below presents for management of HCV(27.7 M with genotype 1a on  04/16/20). Previously seen for right shoulder PJI.  He had undergone right rotator cuff surgery with  tendon repair and bone spurss removed on 03/17/2020 complicated by prosthetic joint infection status post I&D on 12/30 hardware removed and 2 anchors  retained cultures growing P acnes, staph hemolyticus, Staph hominis treated with vancomycin and ceftriaxone x 4 weeks EOT 05/12/2020 transition to p.o. amoxicillin 500 mg 3 times daily to complete a year of antibiotics.  Last seen by Dr. Ninetta Lights in May, 2022 with plan to follow-up in 4 months which would complete about a year of antibiotics.  At that time hep C treatment was held off with patient completed amoxicillin.  Wife present at visit. Pt did contract hepatitis C from wife.  His wife works as a Retail banker and contracted hepatitis C at work.  She had been treated for hepatitis C about 5 years ago.  Patient reported that in his 44s he was in [place] when he consumed large amount of seafood and had to be seen by GI for hepatitis episode with jaundice.  He reports he did not have hepatitis C at that point. Today 08/15/22: Doing well. No new complaints. Completed Epclusa a week ago.   Review of Systems  All other systems reviewed and are negative.   Past Medical History:  Diagnosis Date   Cluster headache    G6PD  deficiency    ?   Hypertension    Pleurisy     Outpatient Medications Prior to Visit  Medication Sig Dispense Refill   amLODipine (NORVASC) 5 MG tablet TAKE 1 TABLET (5 MG TOTAL) BY MOUTH DAILY. (Patient not taking: Reported on 08/15/2022) 90 tablet 0   baclofen (LIORESAL) 10 MG tablet Take 1 tablet (10 mg total) by mouth 3 (three) times daily. As needed for muscle spasm (Patient not taking: Reported on 03/26/2022) 30 tablet 0   EPCLUSA 400-100 MG TABS Take 1 tablet by mouth daily. (Patient not taking: Reported on 08/15/2022) 28 tablet 2   fluocinonide cream (LIDEX) 0.05 % Apply 1 Application topically 2 (two) times daily. (Patient not taking: Reported on 03/26/2022) 30 g 0   gabapentin (NEURONTIN) 300 MG capsule Take 300 mg by mouth 3 (three) times daily. (Patient not taking: Reported on 03/26/2022)     lisinopril (ZESTRIL) 20 MG tablet TAKE 1 TABLET BY MOUTH EVERY DAY (Patient not taking: Reported on 03/26/2022) 90 tablet 3   meloxicam (MOBIC) 15 MG tablet TAKE 1 TABLET BY MOUTH ONCE A DAY TAKE DAILY FOR TWO WEEKS, AND THEN ONLY AS NEEDED AFTERWARDS. (Patient not taking: Reported on 03/26/2022)     PROAIR HFA 108 (90 Base) MCG/ACT inhaler TAKE 2 PUFFS BY MOUTH EVERY 6 HOURS AS NEEDED FOR WHEEZE OR SHORTNESS OF BREATH (Patient not taking: Reported on 03/26/2022) 8.5 Inhaler 2  promethazine (PHENERGAN) 25 MG tablet Take 1 tablet (25 mg total) by mouth every 6 (six) hours as needed for nausea or vomiting. (Patient not taking: Reported on 03/26/2022) 20 tablet 1   SUMAtriptan (IMITREX) 6 MG/0.5ML SOLN injection Inject 6 mg into the skin as needed (for cluster headaches). (Patient not taking: Reported on 03/26/2022)     triamcinolone cream (KENALOG) 0.1 % SMARTSIG:sparingly Topical Twice Daily PRN (Patient not taking: Reported on 03/26/2022)     No facility-administered medications prior to visit.     Allergies  Allergen Reactions   Augmentin [Amoxicillin-Pot Clavulanate] Nausea And Vomiting     "throws up violently"   Chlorthalidone Other (See Comments)    Felt bad/lethargic   Losartan Hives    Potential reaction to losartan   Sulfa Antibiotics Other (See Comments)    Jaundice    Social History   Tobacco Use   Smoking status: Every Day    Packs/day: 1    Types: Cigarettes    Passive exposure: Never   Smokeless tobacco: Never   Tobacco comments:    Has thought about cutting back  Vaping Use   Vaping Use: Never used  Substance Use Topics   Alcohol use: Yes    Alcohol/week: 2.0 standard drinks of alcohol    Types: 2 Cans of beer per week    Comment: states a couple times per week   Drug use: Never    Family History  Problem Relation Age of Onset   Cancer Mother        Lung   Diabetes Father    Heart disease Father        CHF   Colon polyps Brother    Colon polyps Brother    Hypertension Neg Hx     Objective:   Vitals:   08/15/22 1513  BP: (!) 187/94  Pulse: 70  Temp: 97.9 F (36.6 C)  TempSrc: Oral  SpO2: 95%  Weight: 151 lb (68.5 kg)   Body mass index is 21.98 kg/m.  Physical Exam Constitutional:      General: He is not in acute distress.    Appearance: He is normal weight. He is not toxic-appearing.  HENT:     Head: Normocephalic and atraumatic.     Right Ear: External ear normal.     Left Ear: External ear normal.     Nose: No congestion or rhinorrhea.     Mouth/Throat:     Mouth: Mucous membranes are moist.     Pharynx: Oropharynx is clear.  Eyes:     Extraocular Movements: Extraocular movements intact.     Conjunctiva/sclera: Conjunctivae normal.     Pupils: Pupils are equal, round, and reactive to light.  Cardiovascular:     Rate and Rhythm: Normal rate and regular rhythm.     Heart sounds: No murmur heard.    No friction rub. No gallop.  Pulmonary:     Effort: Pulmonary effort is normal.     Breath sounds: Normal breath sounds.  Abdominal:     General: Abdomen is flat. Bowel sounds are normal.     Palpations: Abdomen is  soft.  Musculoskeletal:        General: No swelling. Normal range of motion.     Cervical back: Normal range of motion and neck supple.  Skin:    General: Skin is warm and dry.  Neurological:     General: No focal deficit present.     Mental Status: He is oriented to person,  place, and time.  Psychiatric:        Mood and Affect: Mood normal.    Lab Results: Lab Results  Component Value Date   WBC 5.7 03/26/2022   HGB 14.2 03/26/2022   HCT 39.3 03/26/2022   MCV 96.6 03/26/2022   PLT 223 03/26/2022    Lab Results  Component Value Date   CREATININE 1.02 06/12/2022   BUN 11 06/12/2022   NA 141 06/12/2022   K 4.2 06/12/2022   CL 106 06/12/2022   CO2 29 06/12/2022    Lab Results  Component Value Date   ALT 17 06/12/2022   AST 23 06/12/2022   GGT 135 (H) 03/26/2022   ALKPHOS 86 03/06/2022   BILITOT 0.2 06/12/2022     Assessment & Plan:  #Chronic HCV infection c/b cirrhosis -HCV genotyper 1a, F2, [place]  was c.w portal HTN, cirrhosis, potential focal lesion, VL 78,500 SP epsclusa x 12 weeks(completed a week ago->one missed dose) - HCV RNA at 4 week <15 Plan: -MRI abd for lesion -Labs: end of treatment  -F/U in 1 month for review of imaging -Refer to GI for EGD  #HAB and HBV non-immune -will do at next visit #Porphyruria cutanea tarda -Followed by oncology, rash started as blister this August with test with PCT. Referred to hepatology for further management of porphyrias. Referred to ID in case HCV is contributing to porphyria manifestation.  -Lesions have improved now, compared ot prior to treatment  #Tobacco and Etoh abuse -3-4 beers on the  weekend, counseled on drinking cessation -sent nicotine gum for smoking cessation   Danelle Earthly, MD [facility]   08/15/22  3:33 PM   I have personally spent 34 minutes involved in face-to-face and non-face-to-face activities for this patient on the day of the visit.  Professional time spent includes the following activities: Preparing to see the patient (review of tests), Obtaining and/or reviewing separately obtained history (admission/discharge record), Performing a medically appropriate examination and/or evaluation , Ordering medications/tests/procedures, referring and communicating with other health care professionals, Documenting clinical information in the EMR, Independently interpreting results (not separately reported), Communicating results to the patient/family/caregiver, Counseling and educating the patient/family/caregiver and Care coordination (not separately reported).

## 2022-08-17 LAB — COMPLETE METABOLIC PANEL WITH GFR
AG Ratio: 1.3 (calc) (ref 1.0–2.5)
ALT: 21 U/L (ref 9–46)
AST: 30 U/L (ref 10–35)
Albumin: 4.2 g/dL (ref 3.6–5.1)
Alkaline phosphatase (APISO): 95 U/L (ref 35–144)
BUN: 18 mg/dL (ref 7–25)
CO2: 27 mmol/L (ref 20–32)
Calcium: 9.8 mg/dL (ref 8.6–10.3)
Chloride: 105 mmol/L (ref 98–110)
Creat: 0.92 mg/dL (ref 0.70–1.35)
Globulin: 3.2 g/dL (calc) (ref 1.9–3.7)
Glucose, Bld: 109 mg/dL — ABNORMAL HIGH (ref 65–99)
Potassium: 4.2 mmol/L (ref 3.5–5.3)
Sodium: 140 mmol/L (ref 135–146)
Total Bilirubin: 0.3 mg/dL (ref 0.2–1.2)
Total Protein: 7.4 g/dL (ref 6.1–8.1)
eGFR: 94 mL/min/{1.73_m2} (ref >=60)

## 2022-08-17 LAB — CBC WITH DIFFERENTIAL/PLATELET
Absolute Monocytes: 540 cells/uL (ref 200–950)
Basophils Absolute: 59 cells/uL (ref 0–200)
Basophils Relative: 0.8 %
Eosinophils Absolute: 733 cells/uL — ABNORMAL HIGH (ref 15–500)
Eosinophils Relative: 9.9 %
HCT: 40 % (ref 38.5–50.0)
Hemoglobin: 13.9 g/dL (ref 13.2–17.1)
Lymphs Abs: 3086 cells/uL (ref 850–3900)
MCH: 33.5 pg — ABNORMAL HIGH (ref 27.0–33.0)
MCHC: 34.8 g/dL (ref 32.0–36.0)
MCV: 96.4 fL (ref 80.0–100.0)
MPV: 8.6 fL (ref 7.5–12.5)
Monocytes Relative: 7.3 %
Neutro Abs: 2982 cells/uL (ref 1500–7800)
Neutrophils Relative %: 40.3 %
Platelets: 248 10*3/uL (ref 140–400)
RBC: 4.15 10*6/uL — ABNORMAL LOW (ref 4.20–5.80)
RDW: 12 % (ref 11.0–15.0)
Total Lymphocyte: 41.7 %
WBC: 7.4 10*3/uL (ref 3.8–10.8)

## 2022-08-17 LAB — HEPATITIS C RNA QUANTITATIVE
HCV Quantitative Log: <1.18 log IU/mL
HCV RNA, PCR, QN: <15 IU/mL

## 2022-08-24 ENCOUNTER — Ambulatory Visit (HOSPITAL_COMMUNITY): Admission: RE | Admit: 2022-08-24 | Payer: 59 | Source: Ambulatory Visit

## 2023-03-21 ENCOUNTER — Encounter: Payer: 59 | Admitting: Internal Medicine

## 2023-03-22 ENCOUNTER — Encounter: Payer: Self-pay | Admitting: Internal Medicine

## 2023-04-08 ENCOUNTER — Telehealth: Payer: Self-pay

## 2023-04-08 NOTE — Telephone Encounter (Signed)
Unable to reach pt by phone and left v/m requesting cb to [facility]. Sending note to Dr Alphonsus Sias and [facility] Sias pool.

## 2023-04-08 NOTE — Telephone Encounter (Signed)
Called and spoke to pt's wife per DPR. She said he was doing a little better. Only having GI symptoms. I advised her how to do a virtual visit on [facility] website if needed.
